# Patient Record
Sex: Female | Born: 1960 | Race: White | Hispanic: No
Health system: Midwestern US, Academic
[De-identification: ages and names within clinical notes are randomized; demographics above are authoritative.]

---

## 2016-06-21 ENCOUNTER — Encounter: Admit: 2016-06-21 | Discharge: 2016-06-21 | Payer: Commercial Managed Care - PPO

## 2016-06-22 MED ORDER — PROPRANOLOL 60 MG PO CS24
ORAL_CAPSULE | Freq: Every day | 1 refills | Status: AC
Start: 2016-06-22 — End: 2017-01-18

## 2017-01-16 ENCOUNTER — Encounter: Admit: 2017-01-16 | Discharge: 2017-01-16 | Payer: Commercial Managed Care - HMO

## 2017-01-18 MED ORDER — PROPRANOLOL 60 MG PO CS24
ORAL_CAPSULE | Freq: Every day | 0 refills | Status: AC
Start: 2017-01-18 — End: 2017-03-29

## 2017-03-27 ENCOUNTER — Encounter: Admit: 2017-03-27 | Discharge: 2017-03-27 | Payer: Commercial Managed Care - HMO

## 2017-03-29 MED ORDER — PROPRANOLOL 60 MG PO CS24
ORAL_CAPSULE | Freq: Every day | 0 refills | Status: AC
Start: 2017-03-29 — End: 2017-06-28

## 2017-04-14 ENCOUNTER — Ambulatory Visit: Admit: 2017-04-14 | Discharge: 2017-04-15 | Payer: No Typology Code available for payment source

## 2017-04-14 ENCOUNTER — Encounter: Admit: 2017-04-14 | Discharge: 2017-04-14 | Payer: Commercial Managed Care - HMO

## 2017-04-14 DIAGNOSIS — R198 Other specified symptoms and signs involving the digestive system and abdomen: Principal | ICD-10-CM

## 2017-04-14 DIAGNOSIS — I471 Supraventricular tachycardia: Principal | ICD-10-CM

## 2017-04-15 ENCOUNTER — Encounter: Admit: 2017-04-15 | Discharge: 2017-04-15 | Payer: Commercial Managed Care - HMO

## 2017-04-28 ENCOUNTER — Ambulatory Visit: Admit: 2017-04-28 | Discharge: 2017-04-28 | Payer: No Typology Code available for payment source

## 2017-04-28 ENCOUNTER — Encounter: Admit: 2017-04-28 | Discharge: 2017-04-28 | Payer: Commercial Managed Care - HMO

## 2017-04-28 DIAGNOSIS — I471 Supraventricular tachycardia: Principal | ICD-10-CM

## 2017-05-03 ENCOUNTER — Encounter: Admit: 2017-05-03 | Discharge: 2017-05-03 | Payer: Commercial Managed Care - HMO

## 2017-05-03 DIAGNOSIS — R0600 Dyspnea, unspecified: ICD-10-CM

## 2017-05-03 DIAGNOSIS — I471 Supraventricular tachycardia: Principal | ICD-10-CM

## 2017-05-04 ENCOUNTER — Encounter: Admit: 2017-05-04 | Discharge: 2017-05-05

## 2017-05-05 ENCOUNTER — Encounter: Admit: 2017-05-05 | Discharge: 2017-05-05 | Payer: Commercial Managed Care - HMO

## 2017-06-27 ENCOUNTER — Encounter: Admit: 2017-06-27 | Discharge: 2017-06-27 | Payer: Commercial Managed Care - HMO

## 2017-06-28 MED ORDER — PROPRANOLOL 60 MG PO CS24
ORAL_CAPSULE | Freq: Every day | 2 refills | Status: AC
Start: 2017-06-28 — End: 2018-03-28

## 2018-01-14 ENCOUNTER — Encounter: Admit: 2018-01-14 | Discharge: 2018-01-14 | Payer: Commercial Managed Care - HMO

## 2018-01-24 ENCOUNTER — Encounter: Admit: 2018-01-24 | Discharge: 2018-01-24 | Payer: Commercial Managed Care - HMO

## 2018-02-03 ENCOUNTER — Encounter: Admit: 2018-02-03 | Discharge: 2018-02-03 | Payer: Commercial Managed Care - HMO

## 2018-02-07 ENCOUNTER — Encounter: Admit: 2018-02-07 | Discharge: 2018-02-07 | Payer: Commercial Managed Care - HMO

## 2018-03-26 ENCOUNTER — Encounter: Admit: 2018-03-26 | Discharge: 2018-03-26 | Payer: Commercial Managed Care - HMO

## 2018-03-26 DIAGNOSIS — R002 Palpitations: ICD-10-CM

## 2018-03-26 DIAGNOSIS — I471 Supraventricular tachycardia: Principal | ICD-10-CM

## 2018-03-28 MED ORDER — PROPRANOLOL 60 MG PO CS24
ORAL_CAPSULE | Freq: Every day | 0 refills | Status: AC
Start: 2018-03-28 — End: 2018-07-11

## 2018-07-10 ENCOUNTER — Encounter: Admit: 2018-07-10 | Discharge: 2018-07-10

## 2018-07-10 DIAGNOSIS — I471 Supraventricular tachycardia: Secondary | ICD-10-CM

## 2018-07-10 DIAGNOSIS — R002 Palpitations: Secondary | ICD-10-CM

## 2018-07-11 MED ORDER — PROPRANOLOL 60 MG PO CS24
ORAL_CAPSULE | Freq: Every day | 0 refills | Status: DC
Start: 2018-07-11 — End: 2018-08-06

## 2018-07-24 ENCOUNTER — Encounter: Admit: 2018-07-24 | Discharge: 2018-07-24

## 2018-07-26 ENCOUNTER — Encounter: Admit: 2018-07-26 | Discharge: 2018-07-26

## 2018-07-26 LAB — BASIC METABOLIC PANEL
Lab: 0.6
Lab: 106
Lab: 141
Lab: 27
Lab: 3.3 — ABNORMAL LOW (ref 3.4–5.1)
Lab: 8
Lab: 8.8
Lab: 81
Lab: <8 — ABNORMAL LOW (ref 9–23)
Lab: >60
Lab: >60

## 2018-07-26 NOTE — Telephone Encounter
07/26/18 called and left message for pt to call back. Need to verify pcp and to make sure she hasn't been seen anywhere since last seen here in 2019 edh

## 2018-07-28 ENCOUNTER — Encounter: Admit: 2018-07-28 | Discharge: 2018-07-28

## 2018-07-29 ENCOUNTER — Encounter: Admit: 2018-07-29 | Discharge: 2018-07-29

## 2018-07-29 ENCOUNTER — Ambulatory Visit: Admit: 2018-07-29 | Discharge: 2018-07-30

## 2018-07-29 DIAGNOSIS — I471 Supraventricular tachycardia: Secondary | ICD-10-CM

## 2018-07-29 DIAGNOSIS — R198 Other specified symptoms and signs involving the digestive system and abdomen: Secondary | ICD-10-CM

## 2018-07-29 NOTE — Patient Instructions
We would like you to follow up in  3 months with Dr Arrie Eastern.     Please schedule the following testing: Regadenoson Stress Test.  [facility] Scheduling will call you with appointment time.     In order to provide you the best care possible we ask that you follow up as below:    For NON-URGENT questions please contact us through your MyChart account.   For all medication refills please contact your pharmacy or send a request through [place].   For all questions that may need to be addressed urgently please call the nursing triage line at [phone] Monday - Friday 8-5 only. Please leave a detailed message with your name, date of birth, and reason for your call.    To schedule an appointment call [phone].     Please allow 10-15 business days for the results of any testing to be reviewed. Please call our office if you have not heard from a nurse within this time frame.

## 2018-07-29 NOTE — Progress Notes
Patient requested MPI to be done at [facility]. Requested PA.  Will fax order after PA complete.

## 2018-07-30 ENCOUNTER — Encounter: Admit: 2018-07-30 | Discharge: 2018-07-30

## 2018-07-30 DIAGNOSIS — R198 Other specified symptoms and signs involving the digestive system and abdomen: Secondary | ICD-10-CM

## 2018-08-02 ENCOUNTER — Ambulatory Visit: Admit: 2018-08-02 | Discharge: 2018-08-03

## 2018-08-02 ENCOUNTER — Encounter: Admit: 2018-08-02 | Discharge: 2018-08-02

## 2018-08-02 DIAGNOSIS — I471 Supraventricular tachycardia: Secondary | ICD-10-CM

## 2018-08-03 ENCOUNTER — Encounter: Admit: 2018-08-03 | Discharge: 2018-08-03

## 2018-08-03 ENCOUNTER — Ambulatory Visit: Admit: 2018-08-12 | Discharge: 2018-08-12

## 2018-08-03 DIAGNOSIS — E782 Mixed hyperlipidemia: Secondary | ICD-10-CM

## 2018-08-03 DIAGNOSIS — R9439 Abnormal result of other cardiovascular function study: Principal | ICD-10-CM

## 2018-08-03 DIAGNOSIS — R0600 Dyspnea, unspecified: Secondary | ICD-10-CM

## 2018-08-03 DIAGNOSIS — Z1159 Encounter for screening for other viral diseases: Secondary | ICD-10-CM

## 2018-08-03 MED ORDER — NITROGLYCERIN 0.4 MG SL SUBL
.4 mg | SUBLINGUAL | 0 refills | Status: CN | PRN
Start: 2018-08-03 — End: ?

## 2018-08-03 MED ORDER — DOCUSATE SODIUM 100 MG PO CAP
100 mg | Freq: Every day | ORAL | 0 refills | Status: CN | PRN
Start: 2018-08-03 — End: ?

## 2018-08-03 MED ORDER — ACETAMINOPHEN 325 MG PO TAB
650 mg | ORAL | 0 refills | Status: CN | PRN
Start: 2018-08-03 — End: ?

## 2018-08-03 MED ORDER — ALUMINUM-MAGNESIUM HYDROXIDE 200-200 MG/5 ML PO SUSP
30 mL | ORAL | 0 refills | Status: CN | PRN
Start: 2018-08-03 — End: ?

## 2018-08-03 MED ORDER — TEMAZEPAM 15 MG PO CAP
15 mg | Freq: Every evening | ORAL | 0 refills | Status: CN | PRN
Start: 2018-08-03 — End: ?

## 2018-08-03 NOTE — Progress Notes
Deb with [facility], [phone], confirmed benefits and eligibility:  Current and active since 04/13/2014, $350 deductible with required co-insurance of 20% to max OOP $1400, then plan will pay 100% of allowable charges.  No pre-certification is required for [facility] [id number].  Reference #[id number]

## 2018-08-03 NOTE — Patient Instructions
CARDIAC CATHETERIZATION   PRE-ADMISSION INSTRUCTIONS    Patient Name: Katie Wood  MRN#: [id number]  Date of Birth: 01/17/1960 (58 y.o.)  Today's Date: 08/03/2018    PROCEDURE:  You are scheduled for a Left Diagnostic Coronary Angiogram and Coronary Angiogram with possible Angioplasty/Stenting with Dr. Harley Alto.    PROCEDURE DATE AND ARRIVAL TIME:    Your procedure date is 08/12/18.  You will receive a call from the Cath lab staff between 8:00 a.m. and noon on the business day prior to your procedure to let you know at what time to arrive on the day of your procedure.    Please check in at the Admitting Desk in the [facility] for your procedure. [facility] Entrance and and take a right. Continue down the hallway past [facility] office. That hall will take you into the [facility]. Check in at the desk on the left side.)     (If you have further questions regarding your arrival time for the CV lab, please call [phone] by 3:00pm the day before your procedure. Please leave a message with your name and number, your call will be returned in a timely manner.)    SPECIAL MEDICATION INSTRUCTIONS  Nothing to eat after midnight before your procedure. No caffeine for 24 hours prior to your procedure. You will be under general anesthesia for this procedure.  DO NOT eat or drink anything after midnight the night before your procedure.     Any new prescriptions will be sent to your pharmacy listed on file with [place].   HOLD ALL over the counter vitamins or supplements on the morning of your procedure.  Please either take 4 baby aspirins (4 times 81mg ) or one full strength NON-COATED 325mg  aspirin.       Additional Instructions  If you wear CPAP, please bring your mask and machine with you to the hospital.    Take a bath or shower with anti-bacterial soap the evening before, or the morning of the procedure.     Bring photo ID and your health insurance card(s). Arrange for a driver to take you home from the hospital. Please arrange for a friend or family member to take you home from this test. You cannot take a Taxi, [employer], or public transportation as there has to be a responsible person to help care for you after sedation    Bring an accurate list of your current medications with you to the hospital (all medications and supplements taken daily).  Please use the medication list below and write in the date and time when you took your last dose before your procedure. Update this list of medications as needed.      Wear comfortable clothes and don't bring valuables, other than photo identification card, with you to the hospital.    Please pack a bag for an overnight stay.     Please review your pre-procedure instructions and bring them with you on the day of your procedure.  Call the office at [phone] with any questions. You may ask to speak with Dr. Adella Hare Dendi's nurse.        ALLERGIES  No Known Allergies    CURRENT MEDICATIONS   Kaytlynne, Basil   Home Medication Instructions HAR:    Printed on:08/03/18 1533   Medication Information                      baclofen (LIORESAL) 10 mg tablet  Take  10 mg by mouth twice daily.             cetirizine (ZYRTEC) 10 mg tablet  Take 10 mg by mouth every morning.             duloxetine DR (CYMBALTA) 60 mg capsule  Take 60 mg by mouth daily.             dupilumab (DUPIXENT) 300 mg/2 mL injectable SYRINGE  Inject 300 mg under the skin every 14 days.             linaclotide(+) (LINZESS) 290 mcg capsule  Take 290 mcg by mouth daily 30 minutes before breakfast.             omeprazole (PRILOSEC OTC) 20 mg PO tablet  take 20 mg by mouth Daily.              omeprazole DR (PRILOSEC) 20 mg capsule  Take 20 mg by mouth daily before breakfast.             pregabalin (LYRICA) 50 mg capsule  Take 50 mg by mouth twice daily.             propranolol LA (INDERAL LA) 60 mg capsule  Take 1 capsule by mouth once daily traZODone (DESYREL) 50 mg tablet  Take 50 mg by mouth daily.               _________________________________________  Form completed by: Maryann Conners, RN  Date completed: 08/03/18  Method: Via MyChart.

## 2018-08-04 ENCOUNTER — Encounter: Admit: 2018-08-04 | Discharge: 2018-08-04

## 2018-08-04 NOTE — Telephone Encounter
-----   Message from [place] sent at 08/04/2018 11:47 AM CDT -----  Regarding: RAD-Upcoming Surgery  She has questions about upcoming Surgery. [phone]

## 2018-08-04 NOTE — Telephone Encounter
I spoke with patient regarding her questions with cardiac cath on 08/12/18.     We discussed that her knee surgery may have to be postponed if a stent is placed due to being on ASA and Plavix.     Pt wants to keep the procedure day on 7/31 instead of moving it up.    Pt had no further questions or concerns at this time.

## 2018-08-05 NOTE — Progress Notes
Interventional Cardiology Progress Note     Procedure Date: 08/12/2018     Considered Procedure(s):  left cardiac catheterization      Indication:   abnormal stress test   ___________________________________________________________________________________________________________________________________    Chief Complaint: Palpitations     Assessment and Plan:     AVNRT status post ablation  GERD  Headaches  Hyperlipidemia  Fibromyalgia    Regadenoson thallium stress test 08/02/2018    Left Ventricular Ejection Fraction (post stress, in the resting state) =??? 63 %.   ???  Left Ventricular End Diastolic Volume: 84 mL  ???  SUMMARY/OPINION:??????This study is abnormal.  There is a moderate size, mild to moderate intensity, mostly reversible perfusion defect involving the anterior septal wall from the base to the apical segments and the mid to apical anterior wall.  This suggests ischemia in an LAD type distribution.  The presence of breast tissue attenuation does decreases specificity of the study. Left ventricular systolic function is normal.  There are no wall motion abnormalities present.  There are no high risk prognostic indicators present.  The pharmacologic ECG portion of the study is negative for ischemia.  ???  There are no prior studies available for comparison.       Patient referred for consideration of cardiac catheterization due to abnormal stress test (details above). Labs were reviewed without significant findings. Cr today 0.71. Patient denies allergy to contrast dye or iodine. The procedure as well as the risks and benefits were explained ot the patient at length.  Patient is agreeable to proceed with cardiac catheterization to further define coronary anatomy.      Jearld Lesch, APRN-NP  Interventional Cardiology   Pager [phone]    ____________________________________________________________________________________________________________________________________ History of Present Illness: Katie Wood is a 58 y.o. female with a history of AVNRT status post ablation 2008, GERD, fibromyalgia, headaches, and hyperlipidemia.  She follows with Dr. Wallene Huh as part of perioperative cardiovascular risk assessment prior to knee surgery.  She reported new onset of shortness of breath, weight gain, dyspnea on exertion, and edema.  Subsequent regadenoson thallium stress test was abnormal and revealed moderate size, mild to moderate intensity, mostly reversible perfusion defect involving the anterior septal wall from the base to the apical segments into the mid to apical anterior wall.  This suggest ischemia in the LAD type distribution.  LV systolic function normal.  No wall motion abnormalities.Patient referred for consideration of cardiac catheterization to further define coronary anatomy.     Today, patient reports palpitations. She denies chest pain, palpitations, SOA, DOE, orthopnea, PND, change in weight, dizziness, syncope, diaphoresis, abnormal bleeding, N/V/D, chills, or fever.     Patient Active Problem List    Diagnosis Date Noted   ??? Abnormal stress test 08/12/2018   ??? Fatigue 07/24/2018   ??? Mixed hyperlipidemia 02/03/2016   ??? Postural dizziness with near syncope 04/09/2015   ??? Family history of hypertension in mother 03/26/2015   ??? Family history of hyperlipidemia 03/26/2015   ??? PSVT (paroxysmal supraventricular tachycardia) (HCC) 01/27/2006   ??? Heart palpitations 01/27/2006   ??? Vertigo 01/27/2006   ??? Uterine fibroid 01/27/2006     Medical History:   Diagnosis Date   ??? Symptoms involving digestive system       History reviewed. No pertinent surgical history.   Medications Prior to Admission   Medication Sig Dispense Refill Last Dose   ??? baclofen (LIORESAL) 10 mg tablet Take 10 mg by mouth twice daily.   08/11/2018   ???  cetirizine (ZYRTEC) 10 mg tablet Take 10 mg by mouth every morning.   08/11/2018   ??? duloxetine DR (CYMBALTA) 60 mg capsule Take 60 mg by mouth daily. 08/11/2018   ??? dupilumab (DUPIXENT) 300 mg/2 mL injectable SYRINGE Inject 300 mg under the skin every 14 days.   08/11/2018   ??? linaclotide(+) (LINZESS) 290 mcg capsule Take 290 mcg by mouth daily 30 minutes before breakfast.   08/11/2018   ??? omeprazole (PRILOSEC OTC) 20 mg PO tablet take 20 mg by mouth Daily.    08/11/2018   ??? pregabalin (LYRICA) 50 mg capsule Take 50 mg by mouth twice daily.   08/11/2018   ??? propranolol LA (INDERAL LA) 60 mg capsule Take 1 capsule by mouth once daily 90 capsule 3 08/11/2018   ??? traZODone (DESYREL) 50 mg tablet Take 50 mg by mouth daily.   08/11/2018     No Known Allergies      No current facility-administered medications on file prior to encounter.      Current Outpatient Medications on File Prior to Encounter   Medication Sig Dispense Refill   ??? baclofen (LIORESAL) 10 mg tablet Take 10 mg by mouth twice daily.     ??? cetirizine (ZYRTEC) 10 mg tablet Take 10 mg by mouth every morning.     ??? duloxetine DR (CYMBALTA) 60 mg capsule Take 60 mg by mouth daily.     ??? dupilumab (DUPIXENT) 300 mg/2 mL injectable SYRINGE Inject 300 mg under the skin every 14 days.     ??? linaclotide(+) (LINZESS) 290 mcg capsule Take 290 mcg by mouth daily 30 minutes before breakfast.     ??? omeprazole (PRILOSEC OTC) 20 mg PO tablet take 20 mg by mouth Daily.      ??? pregabalin (LYRICA) 50 mg capsule Take 50 mg by mouth twice daily.     ??? traZODone (DESYREL) 50 mg tablet Take 50 mg by mouth daily.         Social History:   Social History     Tobacco Use   ??? Smoking status: Passive Smoke Exposure - Never Smoker   ??? Smokeless tobacco: Never Used   Substance Use Topics   ??? Alcohol use: Yes     Comment: Occasional      Family History   Problem Relation Age of Onset   ??? Hypertension Mother    ??? Cancer Father    ??? Thyroid Disease Sister    ??? Cancer Brother         Review of Systems  General: negative/normal.  Eyes:  negative/normal.  Ears/Nose/Throat:  negative/normal.  Cardiovascular:  palpitations Respiratory: negative/normal.    Gastrointestinal:  negative/normal.  Genitourinary:  negative/normal.  Musculoskeletal:  negative/normal.  Skin: negative/normal.   Neurologic:  negative/normal.  Psychiatric:  negative/normal.  Endocrine:  negative/normal.  Heme/Lymphatic: negative/normal.  Allergic/Immunologic:  negative/normal.      Physical Exam:  Vital Signs: Last Filed In 24 Hours Vital Signs: 24 Hour Range   BP: 147/86 (07/31 0800)  Temp: 36.9 ???C (98.4 ???F) (07/31 0740)  Pulse: 67 (07/31 0800)  Respirations: 15 PER MINUTE (07/31 0800)  SpO2: 99 % (07/31 0800)  SpO2 Pulse: 66 (07/31 0800)  Height: 170.2 cm (5' 7) (07/31 0740) BP: (137-147)/(86-88)   Temp:  [36.9 ???C (98.4 ???F)]   Pulse:  [67]   Respirations:  [15 PER MINUTE-16 PER MINUTE]   SpO2:  [98 %-99 %]  Gen: appears stated age, in no acute distress  Head: normocephalic, atraumatic  Eyes: sclera non-icteric, EOMs intact   Mouth: mucous membranes are moderately moist  Neck: no JVD  Lungs: CTA bilaterally without rales or rhonchi  Heart: RRR without murmur or gallop appreciated  Abdomen: soft, nontender, bowel sounds present  Extremities: no C/C/E, pedal pulses are intact  Skin: warm and dry  Neurological: A&Ox3, no focal deficits noted  Psychiatric: calm, pleasant, and cooperative      Lab/Radiology/Other Diagnostic Tests:  Labs:    Hematology:    Lab Results   Component Value Date    HGB 12.0 08/12/2018    HCT 35.9 08/12/2018    PLTCT 335 08/12/2018    WBC 5.4 08/12/2018    NEUT 62 01/27/2006    ANC 3.83 01/27/2006    ALC 1.48 01/27/2006    MONA 12 01/27/2006    AMC 0.73 01/27/2006    ABC 0.02 01/27/2006    MCV 89.5 08/12/2018    MCHC 33.5 08/12/2018    MPV 7.4 08/12/2018    RDW 13.7 08/12/2018   , General Chemistry:    Lab Results   Component Value Date    NA 141 08/12/2018    K 3.8 08/12/2018    CL 106 08/12/2018    GAP 7 08/12/2018    BUN 10 08/12/2018    CR 0.71 08/12/2018    GLU 99 08/12/2018    CA 9.1 08/12/2018    ALBUMIN 4.2 12/29/2017 LACTIC 3.9 12/29/2017    MG 2.5 07/01/2015    TOTBILI 0.70 12/29/2017   , HgbA1C:   Lab Results   Component Value Date    HGBA1C 5.2 07/26/2018    and Lipid Profile:   Lab Results   Component Value Date    CHOL 211 01/25/2006    TRIG 106 01/25/2006    HDL 51 01/25/2006    LDL 140 01/25/2006    VLDL 21 01/25/2006              Jearld Lesch, APRN-NP  Interventional Cardiology   Pager [phone]

## 2018-08-06 ENCOUNTER — Encounter: Admit: 2018-08-06 | Discharge: 2018-08-06

## 2018-08-06 DIAGNOSIS — R002 Palpitations: Secondary | ICD-10-CM

## 2018-08-06 DIAGNOSIS — I471 Supraventricular tachycardia: Secondary | ICD-10-CM

## 2018-08-06 MED ORDER — PROPRANOLOL 60 MG PO CS24
ORAL_CAPSULE | Freq: Every day | 3 refills | Status: DC
Start: 2018-08-06 — End: 2019-08-14

## 2018-08-10 ENCOUNTER — Ambulatory Visit: Admit: 2018-08-10 | Discharge: 2018-08-11

## 2018-08-10 DIAGNOSIS — Z01818 Encounter for other preprocedural examination: Secondary | ICD-10-CM

## 2018-08-10 NOTE — Progress Notes
Patient arrived to [facility] for COVID-19 testing 08/10/18 1829. Patient identity confirmed via photo I.D. Nasopharyngeal procedure explained to the patient.   Nasopharyngeal swab completed left  Patient education provided given and instructed patient self isolate until contacted w/ results and further instructions.   Swab collected by Joesph Fillers, RN.    Date symptoms began/reason for testing: Preop

## 2018-08-11 ENCOUNTER — Encounter: Admit: 2018-08-11 | Discharge: 2018-08-11

## 2018-08-11 DIAGNOSIS — Z1159 Encounter for screening for other viral diseases: Secondary | ICD-10-CM

## 2018-08-11 LAB — COVID-19 (SARS-COV-2) PCR

## 2018-08-11 NOTE — Progress Notes
Cardiovascular Labs Scheduling Checklist   1. Date of procedure:07/31    2. Arrival time:0720    3. Patient instructed NPO after MN; clear liquids until: 0620    4. Instructed to check-in at the heart hospital registration desk and bring photo id, insurance cards and a current list of home medications.  Pack an overnight bag in the event you are admitted overnight:Y     5. If you have a history of sleep apnea and use a C-Pap or Bi-Pap machine, please bring it with you to the hospital:N    6. Have a driver available upon discharge as you may not be cleared to drive for 24 or 48 hours post procedure. (exception is RHC/biopsy patient with internal jugular approach not receiving sedation): Y    7. If the patient's language preference is other than English, please indicated native language and need for interpreter:  N    8. Patient instructed to drink 64 oz water the day before their procedure if applicable and not contraindicated: Y    9. Patient instructed no caffeine for 24 hours before procedure: Y    10. Pre-procedure medications reviewed: Y  Hold the following Medications: Continue to take the following:                     11. Contrast allergy with need for contrast allergy prophylaxis:     12. Patient instructed to bath with antibacterial soap or surgical scrub:Y    13. List all same day pre-procedure requirements, i.e. Labs/H&P/EKG:    14. List any same day pre-procedure appointments:N    15. List any isolation precautions and organism:N    16. List any special considerations: N    17. Patient instructed to prepare for delays as there may be urgent or emergent cases: Y    18. Updated visitor guidelines reviewed  (1 visitor, only allowed in waiting room):Y    19. Patient acknowledges understanding of pre-procedure instructions: Katie Wood

## 2018-08-11 NOTE — Progress Notes
Attempted to call patient to discuss negative COVID-19 test result; no answer, left VM directing patient to MyChart, as patient is active on MyChart. RN sent MyChart message notifying patient of result.    Keishawn Darsey, RN

## 2018-08-12 ENCOUNTER — Encounter: Admit: 2018-08-12 | Discharge: 2018-08-12

## 2018-08-12 DIAGNOSIS — R0602 Shortness of breath: Secondary | ICD-10-CM

## 2018-08-12 DIAGNOSIS — I471 Supraventricular tachycardia: Secondary | ICD-10-CM

## 2018-08-12 DIAGNOSIS — R198 Other specified symptoms and signs involving the digestive system and abdomen: Secondary | ICD-10-CM

## 2018-08-12 DIAGNOSIS — R002 Palpitations: Secondary | ICD-10-CM

## 2018-08-12 LAB — CBC
Lab: 12 g/dL (ref 12.0–15.0)
Lab: 4 M/UL (ref 4.0–5.0)
Lab: 5.4 K/UL (ref 4.5–11.0)

## 2018-08-12 LAB — BASIC METABOLIC PANEL: Lab: 141 MMOL/L — ABNORMAL HIGH (ref 137–147)

## 2018-08-12 LAB — LIPID PROFILE: Lab: 293 mg/dL — ABNORMAL HIGH (ref <200)

## 2018-08-12 MED ORDER — TEMAZEPAM 15 MG PO CAP
15 mg | Freq: Every evening | ORAL | 0 refills | Status: DC | PRN
Start: 2018-08-12 — End: 2018-08-12

## 2018-08-12 MED ORDER — SODIUM CHLORIDE 0.9 % IV SOLP
250 mL | INTRAVENOUS | 0 refills | Status: CP
Start: 2018-08-12 — End: ?
  Administered 2018-08-12: 13:00:00 250 mL via INTRAVENOUS

## 2018-08-12 MED ORDER — SODIUM CHLORIDE 0.9 % IV SOLP
INTRAVENOUS | 0 refills | Status: DC
Start: 2018-08-12 — End: 2018-08-12
  Administered 2018-08-12: 13:00:00 1000.000 mL via INTRAVENOUS

## 2018-08-12 MED ORDER — NITROGLYCERIN 0.4 MG SL SUBL
.4 mg | SUBLINGUAL | 0 refills | Status: DC | PRN
Start: 2018-08-12 — End: 2018-08-12

## 2018-08-12 MED ORDER — ALUMINUM-MAGNESIUM HYDROXIDE 200-200 MG/5 ML PO SUSP
30 mL | ORAL | 0 refills | Status: DC | PRN
Start: 2018-08-12 — End: 2018-08-12

## 2018-08-12 MED ORDER — FUROSEMIDE 20 MG PO TAB
20 mg | ORAL_TABLET | Freq: Every morning | ORAL | 3 refills | 90.00000 days | Status: DC
Start: 2018-08-12 — End: 2018-10-14

## 2018-08-12 MED ORDER — POTASSIUM CHLORIDE 10 MEQ PO TBER
10 meq | ORAL_TABLET | Freq: Every day | ORAL | 3 refills | 30.00000 days | Status: DC
Start: 2018-08-12 — End: 2018-10-14

## 2018-08-12 MED ORDER — DOCUSATE SODIUM 100 MG PO CAP
100 mg | Freq: Every day | ORAL | 0 refills | Status: DC | PRN
Start: 2018-08-12 — End: 2018-08-12

## 2018-08-12 MED ORDER — ACETAMINOPHEN 325 MG PO TAB
650 mg | ORAL | 0 refills | Status: DC | PRN
Start: 2018-08-12 — End: 2018-08-12
  Administered 2018-08-12: 17:00:00 650 mg via ORAL

## 2018-08-12 NOTE — Discharge Instructions - Pharmacy
Physician Discharge Summary      Name: Katie Wood  Medical Record Number: [id number]        Account Number:  [id number]  Date Of Birth:  20-Feb-1960                         Age:  58 years   Admit date:  08/12/2018                     Discharge date:  08/12/2018    Attending Physician:  Dr. Harley Alto, MD                Service: Med-Cardiovasc    Physician Summary completed by: Jearld Lesch, APRN-NP     Reason for hospitalization: left cardiac catheterization      Significant PMH:   Medical History:   Diagnosis Date   ??? Symptoms involving digestive system        Allergies: Patient has no known allergies.    Admission Lab/Radiology studies notable for:   Hematology:    Lab Results   Component Value Date    HGB 12.0 08/12/2018    HCT 35.9 08/12/2018    PLTCT 335 08/12/2018    WBC 5.4 08/12/2018    NEUT 62 01/27/2006    ANC 3.83 01/27/2006    ALC 1.48 01/27/2006    MONA 12 01/27/2006    AMC 0.73 01/27/2006    ABC 0.02 01/27/2006    MCV 89.5 08/12/2018    MCHC 33.5 08/12/2018    MPV 7.4 08/12/2018    RDW 13.7 08/12/2018   , General Chemistry:    Lab Results   Component Value Date    NA 141 08/12/2018    K 3.8 08/12/2018    CL 106 08/12/2018    GAP 7 08/12/2018    BUN 10 08/12/2018    CR 0.71 08/12/2018    GLU 99 08/12/2018    CA 9.1 08/12/2018    ALBUMIN 4.2 12/29/2017    LACTIC 3.9 12/29/2017    MG 2.5 07/01/2015    TOTBILI 0.70 12/29/2017   , HgbA1C:   Lab Results   Component Value Date    HGBA1C 5.2 07/26/2018    and Lipid Profile:   Lab Results   Component Value Date    CHOL 293 08/12/2018    TRIG 148 08/12/2018    HDL 55 08/12/2018    LDL 201 08/12/2018    VLDL 30 08/12/2018        Brief Hospital Course:  [name] is a 58 y.o. female with a history of AVNRT status post ablation 2008, GERD, fibromyalgia, headaches, and hyperlipidemia.  She follows with Dr. Wallene Huh as part of perioperative cardiovascular risk assessment prior to knee surgery.  She reported new onset of shortness of breath, weight gain, dyspnea on exertion, and edema.  Subsequent regadenoson thallium stress test was abnormal and revealed moderate size, mild to moderate intensity, mostly reversible perfusion defect involving the anterior septal wall from the base to the apical segments into the mid to apical anterior wall.  This suggest ischemia in the LAD type distribution.  LV systolic function normal.  No wall motion abnormalities. Patient referred for  cardiac catheterization to further define coronary anatomy.     LHC showed normal coronary anatomy and LVEDP elevated to 30. Patient started on lasix 20 mg daily and K 10 meq daily.      She is a  candidate for ongoing risk factor and medical management. Heart healthy diet, exercise, and weight loss were encouraged.    Her post procedure course was uncomplicated and she was discharged home in stable condition     Upon discharge the patient and family were given post procedure instructions/restrictions as well as written instructions ( see Discharge instructions)    ____________________________________________________________________________________    DISCHARGE PLAN:    Follow up: Follow-up with Dr. Wallene Huh on 10/21 previously arranged. Follow up requested with APP in [place] [facility] in 1-2 weeks to follow up on diuretics.     Medication changes: Patient started on lasix 20 mg daily and K 10 meq daily.    Labs: BMP in 1 week ordered.     DISEASE PREVENTION:    Lipids: LDL 201.  No PTA statin. Patient denied cholesterol modifying medications and would like to work to improve through diet and exercise. Recommend to repeat in 3 months to see if lifestyle modifications alone improved cholesterol and then readdress medical therapy options.     HTN: Blood pressure adequately controlled.      Diabetes: Patient is not a diabetic.  HgA1C n/a.  Patient encouraged to follow up with primary care provider for further evaluation/ management. Tobacco: Patient is not a tobacco user.     Obesity: Body mass index is 33.73 kg/m???..  Patient will continue to manage through diet and exercise.  ____________________________________________________________________________________      Condition at Discharge: Stable. right radial access D/I, without evidence of hematoma, bleeding, or bruit. Distal pulses intact. Pt was ambulatory in the halls without complaint prior to discharge. BP (!) 146/88  - Pulse 75  - Temp 36.7 ???C (98.1 ???F)  - Ht 1.702 m (5' 7)  - Wt 97.7 kg (215 lb 6.2 oz)  - SpO2 95%  - BMI 33.73 kg/m???     Discharge Diagnoses:  normal coronary anatomy     Hospital Problems        Active Problems    * (Principal) Abnormal stress test    PSVT (paroxysmal supraventricular tachycardia) (HCC)    Heart palpitations    Mixed hyperlipidemia          Surgical Procedures: None    Significant Diagnostic Studies and Procedures:   LHC 08/12/18:  FINAL IMPRESSIONS:    1. Normal coronary arteries.  2. Elevated left ventricular end-diastolic pressure.  3. No significant gradient across the aortic valve on pullback.  ???  RECOMMENDATION:    Continue medical management and aggressive risk factor control.     Consults:  None    Patient Disposition: Home       Patient instructions/medications:       BASIC METABOLIC PANEL   Standing Status: Future Standing Exp. Date: 08/12/19    Fax: [phone]  Attn: Dr. Wallene Huh     Cardiac Diet    Limiting unhealthy fats and cholesterol is the most important step you can take in reducing your risk for cardiovascular disease.  Unhealthy fats include saturated and trans fats.  Monitor your sodium and cholesterol intake.  Restrict your sodium to 2g (grams) or 2000mg  (milligrams) daily, and your cholesterol to 200mg  daily.    If you have questions regarding your diet at home, you may contact a dietitian at [phone].       Report These Signs and Symptoms    Please contact your doctor if you have any of the following symptoms: Chest pain, shortness of breath, lightheadedness, dizziness, near fainting, palpitations, abd  pain, back pain, or bleeding.     Risk Reduction Plan    Cardiac Event Personal Risk Factor Reduction Plan  **Take this sheet to your physician to show treatment recommendations**  Verlaine Embry  Admission Date: 08/12/2018 LOS: @LOS @       Blood Pressure Risk  Goal: Keep blood pressure below 130/80  Your Numbers:  BP Readings from Last 1 Encounters:  08/12/18 : 137/88     Plan: High blood pressure is the single most important risk factor for cardiac events because it's the #1 cause of cardiac events.  Take medication as prescribed and monitor your blood pressure.    Abnormal lipids (fats in blood) Risk   Goal: Total Cholesterol: <200  LDL (bad cholesterol): primary prevention <100   LDL (bad cholesterol): secondary prevention < 70  HDL (good cholesterol): >40 for men, >50 for women  Triglycerides: <150  Your Numbers: Cholesterol       Date                     Value               Ref Range           Status                01/25/2006               211 (H)             <200 MG/DL          Final            ----------  LDL       Date                     Value               Ref Range           Status                01/25/2006               140 (H)             <100 MG/DL          Final            ----------  HDL       Date                     Value               Ref Range           Status                01/25/2006               51                  >40 MG/DL           Final            ----------  Triglycerides       Date                     Value               Ref Range           Status  01/25/2006               106                 <150 MG/DL          Final            ----------  Plan: Diets high in saturated fat, trans fat and cholesterol can raise blood cholesterol levels increasing your risk of having a cardiac event.  Take medication as prescribed and eat a heart healthy, low sodium diet.    Smoking Risk Goals: If you smoke, STOP!   Plan: Smoking DOUBLES your risk of having a cardiac event. Quitting can greatly reduce your risk. To register for smoking cessation program call [phone] or visit www.smokefree.gov    Diabetes Risk   Goal: Non-diabetic: Below 5.7%  Goal for diabetic: Less than 7%  Your Numbers: Hemoglobin A1C       Date                     Value               Ref Range           Status                07/26/2018               5.2                                     Final            ----------  Plan: If you have diabetes, even if treated, you are at an increased risk of having a cardiac event.     Alcohol Use Risk  Goal: Alcohol use can lead to a cardiac event.  Plan: For men, limit intake to no more than 2 drinks per day.  For women, limit intake to no more than one drink per day.    Weight Management Risk   Goal: Healthy: BMI is 18.5 to 24.9  Overweight: BMI is 25 to 29.9  Obese: BMI is 30 or higher  Morbid Obesity: BMI [...]  Your Numbers: Your BMI (Calculated): 33.73  Plan: If you have questions about your diet after you go home, you can call a dietitian at [phone]    Physical Activity Risk   Goal: Patients should have approval by a physician prior to beginning an exercise program.  Plan: Try to get at least 30 minutes of moderate physical activity five days a week or 20 minutes of vigorous physical activity three days a week with your doctor's approval.    To the Neurology and the NeuroSurg Discharge order sets set add a new order in the education section titled Risk Reduction Plan, in the comments section add the following (default select this new order for neuro and neuro surg and ensure this information is added to the AVS).     Questions About Your Stay    For questions or concerns regarding your hospital stay:    - DURING BUSINESS HOURS (8:00 AM - 4:30 PM):    Call [phone] and asked to be transferred to your discharge attending physician. - AFTER BUSINESS HOURS (4:30 PM - 8:00 AM, on weekends, or holidays):  Call [phone] and ask the operator to page the on-call doctor for the discharge attending physician.  Discharging attending physician: Harley Alto [[id number]]      Procedure Specific Activity    *May return to work/school in 2 days.  *May shower after discharge.  *NO lifting, pushing or pulling more than 5 pounds for one week to affected hand. You may use your wrist splint for a reminder!  *NO strenuous activity for 1 week.  *NO driving for 2 days.  *NO baths or swimming for 1 week.  *NO sexual activity for 1 week.     Incision Care    *Call if there is an increase in pain, swelling, or redness.  *DO NOT soak incision in water.  *NO tub baths, hot tubs, or swimming.  *You may shower after discharge.     Request for Cardiology Appointment   Standing Status: Future Standing Exp. Date: 08/12/23    APP at [facility] to follow up on diuretics     Scheduling Priority: Routine    Location of Appointment [facility] / Mon-Wed-Fri       Current Discharge Medication List       START taking these medications    Details   furosemide (LASIX) 20 mg tablet Take one tablet by mouth every morning.  Qty: 90 tablet, Refills: 3    PRESCRIPTION TYPE:  Normal      potassium chloride (KLOR-CON) 10 mEq tablet Take one tablet by mouth daily. Take with a meal and a full glass of water.  Qty: 90 tablet, Refills: 3    PRESCRIPTION TYPE:  Normal          CONTINUE these medications which have NOT CHANGED    Details   baclofen (LIORESAL) 10 mg tablet Take 10 mg by mouth twice daily.    PRESCRIPTION TYPE:  Historical Med      cetirizine (ZYRTEC) 10 mg tablet Take 10 mg by mouth every morning.    PRESCRIPTION TYPE:  Historical Med      duloxetine DR (CYMBALTA) 60 mg capsule Take 60 mg by mouth daily.    PRESCRIPTION TYPE:  Historical Med      dupilumab (DUPIXENT) 300 mg/2 mL injectable SYRINGE Inject 300 mg under the skin every 14 days. PRESCRIPTION TYPE:  Historical Med      linaclotide(+) (LINZESS) 290 mcg capsule Take 290 mcg by mouth daily 30 minutes before breakfast.    PRESCRIPTION TYPE:  Historical Med      omeprazole (PRILOSEC OTC) 20 mg PO tablet take 20 mg by mouth Daily.     PRESCRIPTION TYPE:  Historical Med      pregabalin (LYRICA) 50 mg capsule Take 50 mg by mouth twice daily.    PRESCRIPTION TYPE:  Historical Med      propranolol LA (INDERAL LA) 60 mg capsule Take 1 capsule by mouth once daily  Qty: 90 capsule, Refills: 3    PRESCRIPTION TYPE:  Normal  Associated Diagnoses: PSVT (paroxysmal supraventricular tachycardia) (HCC); Heart palpitations      traZODone (DESYREL) 50 mg tablet Take 50 mg by mouth daily.    PRESCRIPTION TYPE:  Historical Med           Scheduled appointments:    Nov 02, 2018  8:00 AM CDT  Return Patient with Jen Mow, MD  [facility] (CVM Exam) [address]  [place] [address]  [phone]          Pending items needing follow up: as a bove.     Signed:  Jearld Lesch, APRN-NP  08/15/2018      cc:  Primary Care Physician:  Erskine Emery   Verified  Referring physicians:  Erskine Emery, MD   Additional provider(s):

## 2018-08-12 NOTE — Progress Notes
Patient discharged to home with all belongings.  Discharge instructions, med reconciliation and home wound care instructions given and explained to patient and family both verbally and written.  Accompanied by spouse.  No complaints of pain or discomfort.  Right radial site remains clean, dry, and intact with no evidence of a hematoma or bleeding after ambulation.  Patient escorted to lobby via Therapist, sports.  Patient to follow up with [facility]) or on-call physician with any additional questions or concerns.  All contact numbers provided.  Patient and family acceptant of DC instuctions and report understanding to all information.

## 2018-08-12 NOTE — H&P (View-Only)
Patient presents for cardiac procedure. Please see previously completed H&P below.    Newton Pigg, APRN-C (pager [phone])    ______________________________________________     Jearld Lesch APRN-NP   Nurse Practitioner   Cardiology-Interventional   Progress Notes   Signed   Creation Time:  08/05/18 1019                   Interventional Cardiology Progress Note   ???  Procedure Date: 08/12/2018   ???  Considered Procedure(s):  left cardiac catheterization    ???  Indication:   abnormal stress test   ___________________________________________________________________________________________________________________________________  ???  Chief Complaint: Palpitations   ???  Assessment and Plan:   ???  AVNRT status post ablation  GERD  Headaches  Hyperlipidemia  Fibromyalgia  ???  Regadenoson thallium stress test 08/02/2018  ???  Left Ventricular Ejection Fraction (post stress, in the resting state) =??????63 %.   ???  Left Ventricular End Diastolic Volume: 84 mL  ???  SUMMARY/OPINION:??????This study is abnormal. ???There is a moderate size, mild to moderate intensity, mostly reversible perfusion defect involving the anterior septal wall from the base to the apical segments and the mid to apical anterior wall. ???This suggests ischemia in an LAD type distribution. ???The presence of breast tissue attenuation does decreases specificity of the study. Left ventricular systolic function is normal. ???There are no wall motion abnormalities present. ???There are no high risk prognostic indicators present. ???The pharmacologic ECG portion of the study is negative for ischemia.  ???  There are no prior studies available for comparison.   ???  ???  Patient referred for consideration of cardiac catheterization due to abnormal stress test (details above). Labs were reviewed without significant findings. Cr today 0.71. Patient denies allergy to contrast dye or iodine. The procedure as well as the risks and benefits were explained ot the patient at length.  Patient is agreeable to proceed with cardiac catheterization to further define coronary anatomy.  ???  ???  Jearld Lesch, APRN-NP  Interventional Cardiology   Pager [phone]  ???  ____________________________________________________________________________________________________________________________________  ???  History of Present Illness: Katie Wood is a 58 y.o. female with a history of AVNRT status post ablation 2008, GERD, fibromyalgia, headaches, and hyperlipidemia.  She follows with Dr. Wallene Huh as part of perioperative cardiovascular risk assessment prior to knee surgery.  She reported new onset of shortness of breath, weight gain, dyspnea on exertion, and edema.  Subsequent regadenoson thallium stress test was abnormal and revealed moderate size, mild to moderate intensity, mostly reversible perfusion defect involving the anterior septal wall from the base to the apical segments into the mid to apical anterior wall.  This suggest ischemia in the LAD type distribution.  LV systolic function normal.  No wall motion abnormalities.Patient referred for consideration of cardiac catheterization to further define coronary anatomy.   ???  Today, patient reports palpitations. She denies chest pain, palpitations, SOA, DOE, orthopnea, PND, change in weight, dizziness, syncope, diaphoresis, abnormal bleeding, N/V/D, chills, or fever.   ???       Patient Active Problem List   ??? Diagnosis Date Noted   ??? Abnormal stress test 08/12/2018   ??? Fatigue 07/24/2018   ??? Mixed hyperlipidemia 02/03/2016   ??? Postural dizziness with near syncope 04/09/2015   ??? Family history of hypertension in mother 03/26/2015   ??? Family history of hyperlipidemia 03/26/2015   ??? PSVT (paroxysmal supraventricular tachycardia) (HCC) 01/27/2006   ??? Heart palpitations 01/27/2006   ??? Vertigo  01/27/2006   ??? Uterine fibroid 01/27/2006   ???       Medical History:   Diagnosis Date   ??? Symptoms involving digestive system ??? History reviewed. No pertinent surgical history.   Prescriptions Prior to Admission           Medications Prior to Admission   Medication Sig Dispense Refill Last Dose   ??? baclofen (LIORESAL) 10 mg tablet Take 10 mg by mouth twice daily. ??? ??? 08/11/2018   ??? cetirizine (ZYRTEC) 10 mg tablet Take 10 mg by mouth every morning. ??? ??? 08/11/2018   ??? duloxetine DR (CYMBALTA) 60 mg capsule Take 60 mg by mouth daily. ??? ??? 08/11/2018   ??? dupilumab (DUPIXENT) 300 mg/2 mL injectable SYRINGE Inject 300 mg under the skin every 14 days. ??? ??? 08/11/2018   ??? linaclotide(+) (LINZESS) 290 mcg capsule Take 290 mcg by mouth daily 30 minutes before breakfast. ??? ??? 08/11/2018   ??? omeprazole (PRILOSEC OTC) 20 mg PO tablet take 20 mg by mouth Daily.  ??? ??? 08/11/2018   ??? pregabalin (LYRICA) 50 mg capsule Take 50 mg by mouth twice daily. ??? ??? 08/11/2018   ??? propranolol LA (INDERAL LA) 60 mg capsule Take 1 capsule by mouth once daily 90 capsule 3 08/11/2018   ??? traZODone (DESYREL) 50 mg tablet Take 50 mg by mouth daily. ??? ??? 08/11/2018      ???  No Known Allergies  ???  ???  No current facility-administered medications on file prior to encounter.    ???         Current Outpatient Medications on File Prior to Encounter   Medication Sig Dispense Refill   ??? baclofen (LIORESAL) 10 mg tablet Take 10 mg by mouth twice daily. ??? ???   ??? cetirizine (ZYRTEC) 10 mg tablet Take 10 mg by mouth every morning. ??? ???   ??? duloxetine DR (CYMBALTA) 60 mg capsule Take 60 mg by mouth daily. ??? ???   ??? dupilumab (DUPIXENT) 300 mg/2 mL injectable SYRINGE Inject 300 mg under the skin every 14 days. ??? ???   ??? linaclotide(+) (LINZESS) 290 mcg capsule Take 290 mcg by mouth daily 30 minutes before breakfast. ??? ???   ??? omeprazole (PRILOSEC OTC) 20 mg PO tablet take 20 mg by mouth Daily.  ??? ???   ??? pregabalin (LYRICA) 50 mg capsule Take 50 mg by mouth twice daily. ??? ???   ??? traZODone (DESYREL) 50 mg tablet Take 50 mg by mouth daily. ??? ???   ???  ???  Social History:   Social History   ???  Tobacco Use ??? Smoking status: Passive Smoke Exposure - Never Smoker   ??? Smokeless tobacco: Never Used   Substance Use Topics   ??? Alcohol use: Yes   ??? ??? Comment: Occasional            Family History   Problem Relation Age of Onset   ??? Hypertension Mother ???   ??? Cancer Father ???   ??? Thyroid Disease Sister ???   ??? Cancer Brother ???      ???  Review of Systems  General: negative/normal.  Eyes:??? negative/normal.  Ears/Nose/Throat:??? negative/normal.  Cardiovascular:??? palpitations   Respiratory: negative/normal.???   Gastrointestinal:??? negative/normal.  Genitourinary:??? negative/normal.  Musculoskeletal:??? negative/normal.  Skin: negative/normal.   Neurologic:??? negative/normal.  Psychiatric:??? negative/normal.  Endocrine:??? negative/normal.  Heme/Lymphatic: negative/normal.  Allergic/Immunologic:??? negative/normal.  ???  ???  Physical Exam:  Vital Signs: Last Filed In 24 Hours Vital Signs: 24  Hour Range   BP: 147/86 (07/31 0800)  Temp: 36.9 ???C (98.4 ???F) (07/31 0740)  Pulse: 67 (07/31 0800)  Respirations: 15 PER MINUTE (07/31 0800)  SpO2: 99 % (07/31 0800)  SpO2 Pulse: 66 (07/31 0800)  Height: 170.2 cm (5' 7) (07/31 0740) BP: (137-147)/(86-88)   Temp:  [36.9 ???C (98.4 ???F)]   Pulse:  [67]   Respirations:  [15 PER MINUTE-16 PER MINUTE]   SpO2:  [98 %-99 %]     ???   ???     Gen: appears stated age, in no acute distress  Head: normocephalic, atraumatic  Eyes: sclera non-icteric, EOMs intact   Mouth: mucous membranes are moderately moist  Neck: no JVD  Lungs: CTA bilaterally without rales or rhonchi  Heart: RRR without murmur or gallop appreciated  Abdomen: soft, nontender, bowel sounds present  Extremities: no C/C/E, pedal pulses are intact  Skin: warm and dry  Neurological: A&Ox3, no focal deficits noted  Psychiatric: calm, pleasant, and cooperative  ???  ???  Lab/Radiology/Other Diagnostic Tests:  Labs:    Hematology:          Lab Results   Component Value Date   ??? HGB 12.0 08/12/2018   ??? HCT 35.9 08/12/2018   ??? PLTCT 335 08/12/2018   ??? WBC 5.4 08/12/2018 ??? NEUT 62 01/27/2006   ??? ANC 3.83 01/27/2006   ??? ALC 1.48 01/27/2006   ??? MONA 12 01/27/2006   ??? AMC 0.73 01/27/2006   ??? ABC 0.02 01/27/2006   ??? MCV 89.5 08/12/2018   ??? MCHC 33.5 08/12/2018   ??? MPV 7.4 08/12/2018   ??? RDW 13.7 08/12/2018   , General Chemistry:          Lab Results   Component Value Date   ??? NA 141 08/12/2018   ??? K 3.8 08/12/2018   ??? CL 106 08/12/2018   ??? GAP 7 08/12/2018   ??? BUN 10 08/12/2018   ??? CR 0.71 08/12/2018   ??? GLU 99 08/12/2018   ??? CA 9.1 08/12/2018   ??? ALBUMIN 4.2 12/29/2017   ??? LACTIC 3.9 12/29/2017   ??? MG 2.5 07/01/2015   ??? TOTBILI 0.70 12/29/2017   , HgbA1C:         Lab Results   Component Value Date   ??? HGBA1C 5.2 07/26/2018    and Lipid Profile:         Lab Results   Component Value Date   ??? CHOL 211 01/25/2006   ??? TRIG 106 01/25/2006   ??? HDL 51 01/25/2006   ??? LDL 140 01/25/2006   ??? VLDL 21 01/25/2006   ???  ???  ???   ???  ???  Jearld Lesch, APRN-NP  Interventional Cardiology   Pager [phone]  ???  ???  ???

## 2018-08-12 NOTE — Patient Instructions
Post Procedure Radial Access Discharge Instructions-TUKH  ???  When you go home:  ??? You may shower 24 hours after your procedure.  ??? Keep your puncture sites clean. Do not submerge your arm in a hot tub, pool, lake or tub for 1 week.  ??? Keep the area clean and dry for one week (except for daily showers).  ??? Be sure your hands are clean when touching near the site.  ??? If a band-aid or dressing is still in place remove it before showering.  ??? Wash and dry thoroughly but gently.  ??? If needed, for your comfort, you may place a clean band-aid over the puncture site after you are clean and dry. It is best to leave it open to air as soon as it is comfortable to do so.  ??? Do not use ointments, creams, or powders on puncture site.  ??? Inspect site daily.???  Activity: (Unless otherwise instructed or unable to perform)  ??? Avoid any exertion for one week. Exertion is lifting over 5 lbs, or pushing and pulling with the affected arm.  ??? Do not take blood pressure or have blood drawn from the affected arm for 3 days.  ??? No straining for one week.  ??? You may be up and about while relaxing at home as you recover.  ??? You may resume sexual activity in one week.  ??? You may begin driving 2 days after your procedure if you are otherwise able to drive.???  ---It is common to have mild soreness and/or a small, soft bruise around the site that can take up to two weeks to go away. ???A small amount of blood (not more than a teaspoon) from the site is also common.???  WHEN TO CALL THE DOCTOR: Complications are rare but can happen.  ??? If you have significant bleeding (more than a teaspoon) sit down, apply firm pressure at the site and call 911. Bleeding from a large vessel needs professional help.???  ??? If you have a lump underneath the skin (more than a pea size) at the site.  ??? If you have signs of infection at the site such as: redness, warm to touch, drainage, increasing soreness, a fever (100 degrees or more) and/or chills. ??? Soreness that continues more than a week or unusual pain at the puncture site.  ??? Numbness, tingling, weakness in the affected arm.  ??? If your arm becomes cold and pale.  ??? If you have changes of vision, slurred speech or one-sided weakness.?????????  Who do I contact if I need to speak with someone?  During Business Hours:  ??? [place] [facility] Office at the [facility]: [phone] [facility])  ??? [place]:[phone] (Monday-Friday)  ??? [place]: [phone] (Monday-Friday)  ??? [place]: [phone] (Tuesday and Thursday)  ??? [place]: [phone] (Monday-Friday)  ??? [facility]: [phone] (Tuesday, Wednesday and Friday)  ??? [place]:[phone] (Tuesday, Wednesday and Friday)  Nights and Weekends  ??? [place] [facility] Office at the [facility]: [phone]???  This???information ???is meant to serve as a resource to you and your family. It is not meant to be all inclusive. The members of the [facility] at [facility], [phone], will be glad to answer any questions you may have about this booklet or your procedure.

## 2018-08-12 NOTE — Progress Notes
Patient arrived on unit via ambulation accompanied by family. Patient transferred to the bed without assistance. Frailty score equals 3  Assessment completed, refer to flowsheet for details. Orders released, reviewed, and implemented as appropriate. Oriented to surroundings, call light within reach. Plan of care reviewed.  Will continue to monitor and assess.

## 2018-08-17 ENCOUNTER — Encounter: Admit: 2018-08-17 | Discharge: 2018-08-17

## 2018-08-17 DIAGNOSIS — R002 Palpitations: Secondary | ICD-10-CM

## 2018-08-17 DIAGNOSIS — R9439 Abnormal result of other cardiovascular function study: Secondary | ICD-10-CM

## 2018-08-17 DIAGNOSIS — E782 Mixed hyperlipidemia: Secondary | ICD-10-CM

## 2018-08-17 DIAGNOSIS — I471 Supraventricular tachycardia: Secondary | ICD-10-CM

## 2018-08-17 LAB — BASIC METABOLIC PANEL
Lab: 0.7
Lab: 10
Lab: 106 — ABNORMAL HIGH (ref 70–105)
Lab: 81
Lab: 9.3
Lab: 106 K/UL (ref 0–0.45)
Lab: 141 K/UL (ref 1.0–4.8)
Lab: 25 K/UL (ref 0–0.20)
Lab: 4.6 K/UL (ref 0–0.80)

## 2018-08-29 ENCOUNTER — Encounter: Admit: 2018-08-29 | Discharge: 2018-08-29

## 2018-08-29 ENCOUNTER — Ambulatory Visit: Admit: 2018-08-29 | Discharge: 2018-08-30 | Payer: Commercial Managed Care - HMO

## 2018-08-29 DIAGNOSIS — I471 Supraventricular tachycardia: Secondary | ICD-10-CM

## 2018-08-29 DIAGNOSIS — R198 Other specified symptoms and signs involving the digestive system and abdomen: Secondary | ICD-10-CM

## 2018-08-29 NOTE — Patient Instructions
Continue on current medications.     Keep follow up with Dr. Arrie Eastern in October.         In order to provide you the best care possible we ask that you follow up as below:    Please consider signing up for mychart.    For all questions, please call the nursing triage voicemail at [phone] Monday - Friday 8-5 only. Please leave a detailed message with your name, date of birth, and reason for your call.     To schedule an appointment, please call [phone].     Please allow ~ 10 business days for the results of any testing to be reviewed. Please call our office if you have not heard from a nurse within this time frame

## 2018-08-29 NOTE — Progress Notes
Date of Service: 08/29/2018    Katie Wood is a 58 y.o. female.       HPI     Katie Wood resents to clinic as 2-week follow-up post left heart catheterization. She follows with Dr. Wallene Huh and at [facility] visit with him in July he ordered a stress test because the patient is on flecainide for PSVT and had some SOB, weight gain and DOE.  The stress test was abnormal and the patient had a left heart catheterization August 12, 2018 by Dr. Chales Abrahams. The LHC showed normal coronary arteries and elevated left ventricular end-diastolic pressure (30 mm Hg) and the patient was prescribed Lasix 20 mg daily with potassium supplement.  She has been on the diuretic she lost about 7 pounds which she assumes is water weight.  Her shortness of breath is not any better or any worse with the diuretics.  She has had the shortness of breath since she had her original right total knee replacement about 1 year ago.  The shortness of breath is not associated with palpitations.  She says she will get short of breath doing things that she used to do in the past without any issue such as vacuuming, putting the dishes away, walking up to 37 stairs to work.    She continues to have palpitations on a daily basis.  The frequency is about the same as after she had an ablation with Dr. Betti Cruz in 2008.  She continues to take propranolol and the palpitations do not seem to bother her.    About 2 weeks ago she had a revision of her right knee surgery.  Staples are still in place.  She is going to physical therapy about 3 times a week. BMP on 8/7 was wnl. Other medical history includes hyperlipidemia, postural dizziness, fibromyalgia, and fatigue.        Vitals:    08/29/18 1406   BP: 124/76   BP Source: Arm, Left Upper   Pulse: 75   Temp: 36.7 ???C (98.1 ???F)   SpO2: 97%   Weight: 93 kg (205 lb)   Height: 1.702 m (5' 7)   PainSc: Zero     Body mass index is 32.11 kg/m???.     Past Medical History  Patient Active Problem List    Diagnosis Date Noted ??? Abnormal stress test 08/12/2018   ??? Fatigue 07/24/2018     04/29/2018 - EXED:  Non-diagnostic exercise stress echo due to inability to achieve target workload or target heart.  Patient stopped prematurely due to right knee pain.  At 4.1 METS and 77% of predicted maximal heart rate:  No ischemic echocardiographic changes.  No ischemic ECG changes.  Occasional PVCs in recovery.  RESTING ECHO:  Normal left ventricular size with concentric remodeling normal left ventricular ejection fraction of 55-60%.  Normal right ventricular size and systolic function.  No hemodynamically significant valvular abnormalities.  08/02/2018 - Regadenoson MPI:  This study is abnormal.  There is a moderate size, mild to moderate intensity, mostly reversible perfusion defect involving the anterior septal wall from the base to the apical segments and the mid to apical anterior wall.  This suggests ischemia in an LAD type distribution.  The presence of breast tissue attenuation does decreases specificity of the study. Left ventricular systolic function is normal.  There are no wall motion abnormalities present.  There are no high risk prognostic indicators present.  The pharmacologic ECG portion of the study is negative for ischemia.  08/12/2018 -  LHC:  Normal coronary arteries.  Elevated left ventricular end-diastolic pressure.  No significant gradient across the aortic valve on pullback.     ??? Mixed hyperlipidemia 02/03/2016   ??? Postural dizziness with near syncope 04/09/2015   ??? Family history of hypertension in mother 03/26/2015   ??? Family history of hyperlipidemia 03/26/2015     Mother       ??? PSVT (paroxysmal supraventricular tachycardia) (HCC) 01/27/2006      Ablation 01/2006 Holter 2008-average AV rate of 79, 1 episode of non conducted beat 06/2006     ??? Heart palpitations 01/27/2006   ??? Vertigo 01/27/2006     Recent ER 02/2015, 2D  doppler Echo 2008 neg bubble study with/without Valsalva Has been followed by Neurologist also ??? Uterine fibroid 01/27/2006         Review of Systems   Constitution: Negative.   HENT: Negative.         Hears HB in ears.   Eyes: Negative.    Cardiovascular: Positive for dyspnea on exertion and irregular heartbeat.   Respiratory: Negative.    Endocrine: Negative.    Hematologic/Lymphatic: Negative.    Skin: Negative.    Musculoskeletal: Negative.    Gastrointestinal: Negative.    Genitourinary: Negative.    Neurological: Positive for dizziness.        Positional dizziness, when sitting up.   Psychiatric/Behavioral: Negative.    Allergic/Immunologic: Negative.        Physical Exam   Constitutional:   Obese with BMI 32   HENT:   Head: Normocephalic.   Neck: No JVD present.   Cardiovascular: Normal rate, regular rhythm and normal heart sounds.   No murmur heard.  Pulmonary/Chest: Effort normal and breath sounds normal.   Musculoskeletal:         General: No edema.      Comments: No peripheral edema  Right knee edema noted   Neurological: She is alert and oriented to person, place, and time.   Skin: Skin is warm and dry.   Completely healed right radial puncture site without firmness.  Right knee staple line OTA   Psychiatric: She has a normal mood and affect. Her behavior is normal.         Cardiovascular Studies  08/12/2018 LHC: normal coronaries, elevated LVEDP of 30 mm Hg  08/29/2018 EKG: Sinus rhythm at 65 bpm with nonspecific IVCD.  PR 166 ms, QRS duration 117 ms, QT C4 30 ms    Problems Addressed Today  Encounter Diagnoses   Name Primary?   ??? PSVT (paroxysmal supraventricular tachycardia) (HCC) Yes       Assessment and Plan     Katie Wood continues to have palpitations while on propranolol. Last event monitor was in 2017 which showed short runs of SVT with the longest run being in the middle of the night; No AF/AFL, pauses; patient triggers associated with isolated pac's and pvc's.   She does not want to have another event monitor at this time due to her decreased activities since having right knee surgery again.  She thinks she will be more mobile in October when she has an appointment with Dr. Wallene Huh and would like to have EVM at that time.    Plan:  - continue current medications including lasix and potassium   - Follow up with Dr. Wallene Huh in October, as planned, and consider EVM if palpitations continue or worsen    Roosvelt Maser, APRN-C (pager [phone])  Heart Rhythm Management pager [phone]  Current Medications (including today's revisions)  ??? baclofen (LIORESAL) 10 mg tablet Take 10 mg by mouth twice daily.   ??? celecoxib (CELEBREX) 200 mg capsule Take 1 capsule by mouth daily. Take 1 capsule by mouth once daily. Start 5 days before surgery and continue for 30 days after surgery.   ??? cetirizine (ZYRTEC) 10 mg tablet Take 10 mg by mouth every morning.   ??? duloxetine DR (CYMBALTA) 60 mg capsule Take 60 mg by mouth daily.   ??? dupilumab (DUPIXENT) 300 mg/2 mL injectable SYRINGE Inject 300 mg under the skin every 14 days.   ??? furosemide (LASIX) 20 mg tablet Take one tablet by mouth every morning.   ??? linaclotide(+) (LINZESS) 290 mcg capsule Take 290 mcg by mouth daily 30 minutes before breakfast.   ??? omeprazole (PRILOSEC OTC) 20 mg PO tablet take 20 mg by mouth Daily.    ??? potassium chloride (KLOR-CON) 10 mEq tablet Take one tablet by mouth daily. Take with a meal and a full glass of water.   ??? pregabalin (LYRICA) 50 mg capsule Take 50 mg by mouth twice daily.   ??? propranolol LA (INDERAL LA) 60 mg capsule Take 1 capsule by mouth once daily   ??? traZODone (DESYREL) 50 mg tablet Take 50 mg by mouth daily.

## 2018-10-10 NOTE — Telephone Encounter
Left VM to CB.

## 2018-10-10 NOTE — Telephone Encounter
RAD- RC, she is at [phone].    I called and spoke with patient. She states that the swelling is bilateral. Patient states that she has had weight gain, and SOB with exercise. Offered OV with Sharee Pimple for Wednesday at 3:00pm. Patient agreeable with no further concerns or questions.

## 2018-10-10 NOTE — Telephone Encounter
-----   Message from [place] sent at 10/10/2018  1:25 PM CDT -----  Regarding: RAD-Swelling gotten worse  Patient states swelling has gotten worse in feet and legs. Sometimes it hurts. Please call her at [phone]

## 2018-10-14 ENCOUNTER — Encounter: Admit: 2018-10-14 | Discharge: 2018-10-14 | Payer: Commercial Managed Care - HMO

## 2018-10-14 ENCOUNTER — Ambulatory Visit: Admit: 2018-10-14 | Discharge: 2018-10-14 | Payer: Commercial Managed Care - HMO

## 2018-10-14 DIAGNOSIS — I5189 Other ill-defined heart diseases: Secondary | ICD-10-CM

## 2018-10-14 DIAGNOSIS — R198 Other specified symptoms and signs involving the digestive system and abdomen: Secondary | ICD-10-CM

## 2018-10-14 DIAGNOSIS — R030 Elevated blood-pressure reading, without diagnosis of hypertension: Secondary | ICD-10-CM

## 2018-10-14 LAB — COMPREHENSIVE METABOLIC PANEL
Lab: 0.3 mg/dL (ref 0.3–1.2)
Lab: 0.7 mg/dL (ref 0.4–1.00)
Lab: 106 MMOL/L (ref 98–110)
Lab: 141 MMOL/L — ABNORMAL LOW (ref 137–147)
Lab: 3.8 MMOL/L — ABNORMAL LOW (ref 3.5–5.1)
Lab: 7 mg/dL (ref 7–25)
Lab: 9.5 mg/dL — ABNORMAL HIGH (ref 8.5–10.6)
Lab: 96 mg/dL (ref 70–100)

## 2018-10-14 LAB — BNP (B-TYPE NATRIURETIC PEPTI): Lab: 85 pg/mL (ref 0–100)

## 2018-10-14 LAB — CBC AND DIFF
Lab: 4.1 M/UL (ref 4.0–5.0)
Lab: 6.1 10*3/uL (ref 4.5–11.0)

## 2018-10-14 MED ORDER — POTASSIUM CHLORIDE 10 MEQ PO TBER
20 meq | ORAL_TABLET | Freq: Every day | ORAL | 3 refills | 30.00000 days | Status: AC
Start: 2018-10-14 — End: ?

## 2018-10-14 MED ORDER — FUROSEMIDE 20 MG PO TAB
40 mg | ORAL_TABLET | Freq: Every morning | ORAL | 3 refills | 90.00000 days | Status: AC
Start: 2018-10-14 — End: ?

## 2018-10-14 NOTE — Patient Instructions
-   It was nice to see you today!    - Let's increase the furosemide to 40 mg daily until the swelling is gone, then reduce it back down to 20 mg daily. Take 10 mEq of potassium for each 20 mg of furosemide. Limit your total fluid intake to 48-64 ounces per day. Try to limit your sodium to 2,000 mg per day or less.     - Start monitoring daily am weights. Take note of your weight when you feel like the swelling has completely resolved.  If you gain 2 lbs. in 1 day or 5 lbs. over 1 week, it is due to fluid retention. In that case, you may need to take an additional tablet of the furosemide that day.     - Please start monitoring your blood pressure and make a log of your readings. We may need to treat it if you are getting consistent reading 130/80 or above.     - I have ordered an echocardiogram. It can be done in [place]. Let's see if you can schedule it to be done on the same day you see Dr. Arrie Eastern.     - Please send [place] a MyChart message or call our nurse line at [phone] with any questions or concerns in the meantime.

## 2018-10-14 NOTE — Progress Notes
Date of Service: 10/14/2018    Katie Wood is a 58 y.o. female.       HPI    I had the pleasure of seeing Katie Wood today regarding increased edema last week.  She is a very pleasant 58 year old female with a past medical history of AVNRT s/p ablation in 2008, diastolic dysfunction, stress headaches, skin allergies, joint pain, fibromyalgia, GERD and hyperlipidemia.  She was noting increasing shortness of breath and a stress test in July was abnormal and she underwent LHC and coronary angiography on 08/12/2018 and had normal coronary arteries, without evidence of significant coronary artery disease.  Left ventricular end-diastolic pressure was elevated at 30 mmHg.  She was started on Lasix 20 mg daily after her heart catheterization.    Katie Wood reports that last week she noticed increased pedal edema to the point that it was difficult to flex her toes.  She cannot think of anything which may have precipitated the swelling, such as increased sodium intake.  She takes dupilumab injections every 2 weeks to treat pruritus and it has been effective.  Her injection was last week and she wonders if this could be the cause of the swelling.  She also notes increased exertional dyspnea.  She has 30 steps that she walks up on the way to work and she has to stop and catch her breath when she gets to the top.  She denies any chest pain or pressure.  She notes occasional palpitations, particularly when she is feeling stressed.        Vitals:    10/14/18 1320   BP: (!) 140/90   BP Source: Arm, Left Upper   Pulse: 80   SpO2: 98%   Weight: 99.4 kg (219 lb 3.2 oz)   Height: 1.702 m (5' 7)   PainSc: Zero     Body mass index is 34.33 kg/m?Marland Kitchen     Past Medical History  Patient Active Problem List    Diagnosis Date Noted   ? Diastolic dysfunction 10/14/2018   ? Elevated blood pressure reading without diagnosis of hypertension 10/14/2018   ? Abnormal stress test 08/12/2018   ? Fatigue 07/24/2018 04/29/2018 - [facility]:  Non-diagnostic exercise stress echo due to inability to achieve target workload or target heart.  Patient stopped prematurely due to right knee pain.  At 4.1 METS and 77% of predicted maximal heart rate:  No ischemic echocardiographic changes.  No ischemic ECG changes.  Occasional PVCs in recovery.  RESTING ECHO:  Normal left ventricular size with concentric remodeling normal left ventricular ejection fraction of 55-60%.  Normal right ventricular size and systolic function.  No hemodynamically significant valvular abnormalities.  08/02/2018 - Regadenoson MPI:  This study is abnormal.  There is a moderate size, mild to moderate intensity, mostly reversible perfusion defect involving the anterior septal wall from the base to the apical segments and the mid to apical anterior wall.  This suggests ischemia in an LAD type distribution.  The presence of breast tissue attenuation does decreases specificity of the study. Left ventricular systolic function is normal.  There are no wall motion abnormalities present.  There are no high risk prognostic indicators present.  The pharmacologic ECG portion of the study is negative for ischemia.  08/12/2018 - LHC:  Normal coronary arteries.  Elevated left ventricular end-diastolic pressure.  No significant gradient across the aortic valve on pullback.     ? Mixed hyperlipidemia 02/03/2016   ? Postural dizziness with near syncope 04/09/2015   ?  Family history of hypertension in mother 03/26/2015   ? Family history of hyperlipidemia 03/26/2015     Mother       ? PSVT (paroxysmal supraventricular tachycardia) (HCC) 01/27/2006      Ablation 01/2006 Holter 2008-average AV rate of 79, 1 episode of non conducted beat 06/2006     ? Heart palpitations 01/27/2006   ? Vertigo 01/27/2006     Recent ER 02/2015, 2D  doppler Echo 2008 neg bubble study with/without Valsalva Has been followed by Neurologist also     ? Uterine fibroid 01/27/2006     Review of Systems Constitution: Positive for malaise/fatigue.   HENT: Negative.    Eyes: Negative.    Cardiovascular: Negative.    Respiratory: Positive for shortness of breath.    Endocrine: Negative.    Hematologic/Lymphatic: Negative.    Skin: Negative.    Musculoskeletal: Positive for joint swelling (feet and ankles).   Gastrointestinal: Positive for bloating.   Genitourinary: Negative.    Neurological: Negative.    Psychiatric/Behavioral: Negative.    Allergic/Immunologic: Negative.      Physical Exam  General Appearance: no acute distress  Skin: warm & intact  HEENT: unremarkable  Neck Veins: neck veins are flat & not distended  Chest Inspection: chest is normal in appearance  Auscultation/Percussion: lungs clear to auscultation, no rales, rhonchi, or wheezing  Cardiac Rhythm: regular rhythm & normal rate  Cardiac Auscultation: Normal S1 & S2, no S3 or S4, no rub  Murmurs: no cardiac murmurs   Extremities: trace to 1+ lower extremity edema; 2+ symmetric distal pulses  Abdominal Exam: soft, non-tender, bowel sounds normal  Neurologic Exam: oriented to time, place and person; no focal neurologic deficits  Psychiatric: Normal mood and affect.  Behavior is normal. Judgment and thought content normal.     Cardiovascular Studies  ECG was not repeated today.  Until her swelling has resolved, then may decrease it back down to    Problems Addressed Today  Encounter Diagnoses   Name Primary?   ? Diastolic dysfunction Yes   ? Elevated blood pressure reading without diagnosis of hypertension      Assessment and Plan    In conclusion, Katie Wood noted increased lower extremity edema last week without definite precipitating cause.  I do not see that edema or fluid retention listed as a potential side effect of dupilumab.  LVEDP was significantly elevated at 30 mmHg on heart catheterization at the end of July, indicative of diastolic dysfunction.  We discussed this today.  I would like to check some labs today, including a CMP, CBC and BNP.  I would like to repeat a 2D echo with Doppler.  She will increase her furosemide to 40 mg daily and increase potassium chloride to 20 mEq daily until the swelling has resolved, then may decrease the doses back down to 20 mg of furosemide along with 10 mEq of potassium chloride.  She will start monitoring daily morning weights.  I asked her to take note of her morning weight when she feels like her swelling has resolved.  We discussed that if she gains 2 pounds in 24 hours or 5 pounds over 1 week, it is due to fluid retention and she may increase her furosemide back up to 40 mg daily until her weight is back down.  A 2 gram sodium per day diet and moderate fluid restriction were encouraged. Her blood pressure is elevated at 140/90 today.  She is going to start monitoring her blood pressure  at home and make a log of her readings to take with her when she follows up with Dr. Wallene Huh on 10/21.  If she is getting readings consistently greater than 130/80, we may need to treat. Spironolactone may be a good choice, given the diastolic dysfunction.          Current Medications (including today's revisions)  ? baclofen (LIORESAL) 10 mg tablet Take 10 mg by mouth twice daily.   ? celecoxib (CELEBREX) 200 mg capsule Take 1 capsule by mouth daily. Take 1 capsule by mouth once daily. Start 5 days before surgery and continue for 30 days after surgery.   ? cetirizine (ZYRTEC) 10 mg tablet Take 10 mg by mouth every morning.   ? duloxetine DR (CYMBALTA) 60 mg capsule Take 60 mg by mouth daily.   ? dupilumab (DUPIXENT) 300 mg/2 mL injectable SYRINGE Inject 300 mg under the skin every 14 days.   ? furosemide (LASIX) 20 mg tablet Take two tablets by mouth every morning.   ? linaclotide(+) (LINZESS) 290 mcg capsule Take 290 mcg by mouth daily 30 minutes before breakfast.   ? omeprazole (PRILOSEC OTC) 20 mg PO tablet take 20 mg by mouth Daily.    ? potassium chloride (KLOR-CON) 10 mEq tablet Take two tablets by mouth daily. Take with a meal and a full glass of water.   ? pregabalin (LYRICA) 50 mg capsule Take 50 mg by mouth twice daily.   ? propranolol LA (INDERAL LA) 60 mg capsule Take 1 capsule by mouth once daily   ? traZODone (DESYREL) 50 mg tablet Take 50 mg by mouth daily.

## 2019-05-15 ENCOUNTER — Encounter: Admit: 2019-05-15 | Discharge: 2019-05-15 | Payer: Commercial Managed Care - PPO

## 2019-05-15 DIAGNOSIS — R002 Palpitations: Secondary | ICD-10-CM

## 2019-05-15 DIAGNOSIS — R198 Other specified symptoms and signs involving the digestive system and abdomen: Secondary | ICD-10-CM

## 2019-05-15 MED ORDER — ATORVASTATIN 20 MG PO TAB
20 mg | ORAL_TABLET | Freq: Every day | ORAL | 11 refills | Status: AC
Start: 2019-05-15 — End: ?

## 2019-06-19 ENCOUNTER — Encounter: Admit: 2019-06-19 | Discharge: 2019-06-19 | Payer: Commercial Managed Care - PPO

## 2019-06-19 DIAGNOSIS — R002 Palpitations: Secondary | ICD-10-CM

## 2019-06-19 LAB — LIPID PROFILE
Lab: 115
Lab: 166
Lab: 23
Lab: 3
Lab: 52
Lab: 91

## 2019-06-20 ENCOUNTER — Encounter: Admit: 2019-06-20 | Discharge: 2019-06-20 | Payer: Commercial Managed Care - PPO

## 2019-06-20 NOTE — Telephone Encounter
-----   Message from Golda Acre, LPN sent at 04/13/3242  2:24 PM CDT -----  Regarding: RAD- results and HM ?  VM on triage line from patient.  1- she had her lipids drawn and was to call back to see if she is to stay on or off the medication.  2- she has question about heart monitor that she was to wear but could not afford it.  Call her at #[phone].

## 2019-06-20 NOTE — Telephone Encounter
Called pt back. She is curious about her lipid panel from yesterday. I assured pt that it has been resulted and sent on to RAD to review. Informed her that we would call her back once he has reviewed. Pt also states that the Zio patch that was ordered is not affordable for her. Wants to know if there is another option. Will review with RAD and call her back with recs. Pt verbalized understanding and agreement to plan.   Pt also states she had gallbladder taken out yesterday, semi-urgently but is feeling fairly well today.

## 2019-07-13 ENCOUNTER — Encounter: Admit: 2019-07-13 | Discharge: 2019-07-13 | Payer: Commercial Managed Care - PPO

## 2019-07-14 ENCOUNTER — Encounter: Admit: 2019-07-14 | Discharge: 2019-07-14 | Payer: Commercial Managed Care - PPO

## 2019-07-14 NOTE — Progress Notes
Long Term Monitor Placement Record  Ordering Physician: Jen Mow  Diagnosis: palpitation  Length: 14 days  Serial Number: [id number]

## 2019-07-14 NOTE — Progress Notes
Enrollment complete  Spoke with patient, she's currently in [id number] with her husband, stated that to bring over there if I could and was going to wait a couple of days before placing the Zio due to stress from her husband being in the hospital.

## 2019-07-18 ENCOUNTER — Encounter: Admit: 2019-07-18 | Discharge: 2019-07-18 | Payer: Commercial Managed Care - PPO

## 2019-08-12 ENCOUNTER — Encounter: Admit: 2019-08-12 | Discharge: 2019-08-12 | Payer: Commercial Managed Care - PPO

## 2019-08-12 DIAGNOSIS — R002 Palpitations: Secondary | ICD-10-CM

## 2019-08-12 DIAGNOSIS — I471 Supraventricular tachycardia: Secondary | ICD-10-CM

## 2019-08-14 MED ORDER — PROPRANOLOL 60 MG PO CS24
60 mg | ORAL_CAPSULE | Freq: Every evening | ORAL | 0 refills | Status: AC
Start: 2019-08-14 — End: ?

## 2019-09-17 ENCOUNTER — Encounter: Admit: 2019-09-17 | Discharge: 2019-09-17 | Payer: Commercial Managed Care - PPO

## 2019-09-17 DIAGNOSIS — I471 Supraventricular tachycardia: Secondary | ICD-10-CM

## 2019-09-17 DIAGNOSIS — R002 Palpitations: Secondary | ICD-10-CM

## 2019-09-18 MED ORDER — PROPRANOLOL 60 MG PO CS24
60 mg | ORAL_CAPSULE | Freq: Every evening | ORAL | 3 refills | Status: AC
Start: 2019-09-18 — End: ?

## 2019-11-15 ENCOUNTER — Encounter: Admit: 2019-11-15 | Discharge: 2019-11-15 | Payer: Commercial Managed Care - PPO

## 2019-12-02 ENCOUNTER — Encounter: Admit: 2019-12-02 | Discharge: 2019-12-02 | Payer: Commercial Managed Care - PPO

## 2019-12-02 MED ORDER — POTASSIUM CHLORIDE 10 MEQ PO TBER
ORAL_TABLET | Freq: Every day | 0 refills
Start: 2019-12-02 — End: ?

## 2020-01-12 ENCOUNTER — Encounter: Admit: 2020-01-12 | Discharge: 2020-01-12 | Payer: Commercial Managed Care - PPO

## 2020-01-12 MED ORDER — FUROSEMIDE 20 MG PO TAB
ORAL_TABLET | 0 refills
Start: 2020-01-12 — End: ?

## 2020-03-08 ENCOUNTER — Ambulatory Visit: Admit: 2020-03-08 | Discharge: 2020-03-08 | Payer: Commercial Managed Care - PPO

## 2020-03-08 ENCOUNTER — Encounter: Admit: 2020-03-08 | Discharge: 2020-03-08 | Payer: Commercial Managed Care - PPO

## 2020-03-08 DIAGNOSIS — R198 Other specified symptoms and signs involving the digestive system and abdomen: Secondary | ICD-10-CM

## 2020-03-08 DIAGNOSIS — I471 Supraventricular tachycardia: Secondary | ICD-10-CM

## 2020-03-08 DIAGNOSIS — I10 Essential (primary) hypertension: Secondary | ICD-10-CM
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
## 2020-03-11 ENCOUNTER — Encounter: Admit: 2020-03-11 | Discharge: 2020-03-11 | Payer: Commercial Managed Care - PPO

## 2020-03-11 NOTE — Progress Notes
Request for the following medical records, for the purpose Continuity of Care.     Please send the following:      Most Recent Labs     Please Fax to:   FAX#: [phone]  Attn: Sherry

## 2020-03-22 ENCOUNTER — Encounter: Admit: 2020-03-22 | Discharge: 2020-03-22 | Payer: Commercial Managed Care - PPO

## 2020-05-19 ENCOUNTER — Encounter: Admit: 2020-05-19 | Discharge: 2020-05-19 | Payer: Commercial Managed Care - PPO

## 2020-05-19 MED ORDER — ATORVASTATIN 20 MG PO TAB
ORAL_TABLET | Freq: Every day | 3 refills | Status: AC
Start: 2020-05-19 — End: ?

## 2020-05-24 ENCOUNTER — Encounter: Admit: 2020-05-24 | Discharge: 2020-05-24 | Payer: Commercial Managed Care - PPO

## 2020-05-24 DIAGNOSIS — R002 Palpitations: Secondary | ICD-10-CM

## 2020-06-21 ENCOUNTER — Encounter: Admit: 2020-06-21 | Discharge: 2020-06-21 | Payer: Commercial Managed Care - PPO

## 2020-06-27 ENCOUNTER — Encounter: Admit: 2020-06-27 | Discharge: 2020-06-27 | Payer: Commercial Managed Care - PPO

## 2020-07-26 ENCOUNTER — Encounter: Admit: 2020-07-26 | Discharge: 2020-07-26 | Payer: Commercial Managed Care - PPO

## 2020-09-27 ENCOUNTER — Encounter: Admit: 2020-09-27 | Discharge: 2020-09-27 | Payer: Commercial Managed Care - PPO

## 2020-09-27 DIAGNOSIS — I471 Supraventricular tachycardia: Secondary | ICD-10-CM

## 2020-09-27 DIAGNOSIS — R002 Palpitations: Secondary | ICD-10-CM

## 2020-09-27 MED ORDER — PROPRANOLOL 60 MG PO CS24
60 mg | ORAL_CAPSULE | Freq: Every evening | ORAL | 0 refills | Status: AC
Start: 2020-09-27 — End: ?

## 2020-09-27 MED ORDER — ATORVASTATIN 20 MG PO TAB
ORAL_TABLET | Freq: Every day | 0 refills | Status: AC
Start: 2020-09-27 — End: ?

## 2020-11-12 ENCOUNTER — Encounter: Admit: 2020-11-12 | Discharge: 2020-11-12 | Payer: Commercial Managed Care - PPO

## 2020-11-12 DIAGNOSIS — R002 Palpitations: Secondary | ICD-10-CM

## 2020-11-12 DIAGNOSIS — I471 Supraventricular tachycardia: Secondary | ICD-10-CM

## 2020-11-12 MED ORDER — PROPRANOLOL 60 MG PO CS24
60 mg | ORAL_CAPSULE | Freq: Every evening | ORAL | 0 refills | Status: AC
Start: 2020-11-12 — End: ?

## 2020-12-18 ENCOUNTER — Encounter: Admit: 2020-12-18 | Discharge: 2020-12-18 | Payer: Commercial Managed Care - PPO

## 2020-12-18 DIAGNOSIS — I471 Supraventricular tachycardia: Secondary | ICD-10-CM

## 2020-12-18 DIAGNOSIS — R002 Palpitations: Secondary | ICD-10-CM

## 2020-12-18 MED ORDER — PROPRANOLOL 60 MG PO CS24
60 mg | ORAL_CAPSULE | Freq: Every evening | ORAL | 3 refills | Status: AC
Start: 2020-12-18 — End: ?

## 2021-01-13 ENCOUNTER — Encounter: Admit: 2021-01-13 | Discharge: 2021-01-13 | Payer: Commercial Managed Care - PPO

## 2021-01-13 MED ORDER — POTASSIUM CHLORIDE 10 MEQ PO TBER
ORAL_TABLET | Freq: Every day | 0 refills | 30.00000 days | Status: AC
Start: 2021-01-13 — End: ?

## 2021-01-24 ENCOUNTER — Encounter: Admit: 2021-01-24 | Discharge: 2021-01-24 | Payer: Commercial Managed Care - PPO

## 2021-01-24 MED ORDER — ATORVASTATIN 20 MG PO TAB
ORAL_TABLET | Freq: Every day | 0 refills
Start: 2021-01-24 — End: ?

## 2021-01-27 ENCOUNTER — Encounter: Admit: 2021-01-27 | Discharge: 2021-01-27 | Payer: Commercial Managed Care - PPO

## 2021-03-22 ENCOUNTER — Encounter: Admit: 2021-03-22 | Discharge: 2021-03-22 | Payer: Commercial Managed Care - PPO

## 2021-03-22 MED ORDER — ATORVASTATIN 20 MG PO TAB
ORAL_TABLET | 0 refills
Start: 2021-03-22 — End: ?

## 2021-04-27 ENCOUNTER — Encounter: Admit: 2021-04-27 | Discharge: 2021-04-27 | Payer: Commercial Managed Care - PPO

## 2021-04-27 MED ORDER — PROPRANOLOL 60 MG PO CS24
60 mg | ORAL_CAPSULE | ORAL | 0 refills
Start: 2021-04-27 — End: ?

## 2021-04-27 MED ORDER — ATORVASTATIN 20 MG PO TAB
ORAL_TABLET | 0 refills
Start: 2021-04-27 — End: ?

## 2021-05-30 ENCOUNTER — Encounter: Admit: 2021-05-30 | Discharge: 2021-05-30 | Payer: Commercial Managed Care - PPO

## 2021-05-30 MED ORDER — ATORVASTATIN 20 MG PO TAB
ORAL_TABLET | 0 refills
Start: 2021-05-30 — End: ?

## 2021-05-30 MED ORDER — PROPRANOLOL 60 MG PO CS24
60 mg | ORAL_CAPSULE | ORAL | 0 refills
Start: 2021-05-30 — End: ?

## 2021-06-01 ENCOUNTER — Encounter: Admit: 2021-06-01 | Discharge: 2021-06-01 | Payer: Commercial Managed Care - PPO

## 2021-06-24 ENCOUNTER — Encounter: Admit: 2021-06-24 | Discharge: 2021-06-24 | Payer: Commercial Managed Care - PPO

## 2021-07-04 ENCOUNTER — Encounter: Admit: 2021-07-04 | Discharge: 2021-07-04 | Payer: Commercial Managed Care - PPO

## 2021-07-04 VITALS — BP 90/58 | HR 66 | Ht 66.0 in | Wt 203.5 lb

## 2021-07-04 DIAGNOSIS — E782 Mixed hyperlipidemia: Secondary | ICD-10-CM

## 2021-07-04 DIAGNOSIS — R198 Other specified symptoms and signs involving the digestive system and abdomen: Secondary | ICD-10-CM
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
## 2021-07-05 ENCOUNTER — Ambulatory Visit: Admit: 2021-07-04 | Discharge: 2021-07-05 | Payer: Commercial Managed Care - PPO

## 2021-07-05 DIAGNOSIS — I471 Supraventricular tachycardia: Principal | ICD-10-CM

## 2021-07-05 DIAGNOSIS — R002 Palpitations: Secondary | ICD-10-CM

## 2021-07-24 ENCOUNTER — Encounter: Admit: 2021-07-24 | Discharge: 2021-07-24 | Payer: Commercial Managed Care - PPO

## 2021-07-24 DIAGNOSIS — R002 Palpitations: Secondary | ICD-10-CM

## 2021-07-24 DIAGNOSIS — E782 Mixed hyperlipidemia: Secondary | ICD-10-CM

## 2021-07-24 DIAGNOSIS — I10 Essential (primary) hypertension: Secondary | ICD-10-CM

## 2021-07-24 DIAGNOSIS — I471 Supraventricular tachycardia: Secondary | ICD-10-CM

## 2021-07-24 LAB — LIPID PROFILE
CHOLESTEROL: 179
HDL: 49
LDL: 112 — ABNORMAL HIGH
TRIGLYCERIDES: 94
VLDL: 19

## 2021-07-24 LAB — COMPREHENSIVE METABOLIC PANEL
ALBUMIN: 3.8
ALK PHOSPHATASE: 114
ALT: 28
AST: 25
BLD UREA NITROGEN: 10 U/L (ref 25–110)
CALCIUM: 9.5 U/L (ref 7–56)
CHLORIDE: 110 mg/dL — ABNORMAL HIGH (ref 0.3–1.2)
CO2: 28 g/dL (ref 3.5–5.0)
CREATININE: 0.8 mg/dL — ABNORMAL HIGH (ref 70–100)
GFR ESTIMATED: 78
GLUCOSE,PANEL: 100 MMOL/L (ref 21–30)
SODIUM: 144 mg/dL (ref 8.5–10.6)
TOTAL PROTEIN: 6.9 (ref 3–12)

## 2021-07-27 ENCOUNTER — Encounter: Admit: 2021-07-27 | Discharge: 2021-07-27 | Payer: Commercial Managed Care - PPO

## 2021-07-27 MED ORDER — PROPRANOLOL 60 MG PO CS24
60 mg | ORAL_CAPSULE | Freq: Every day | ORAL | 11 refills | Status: AC
Start: 2021-07-27 — End: ?

## 2021-08-10 ENCOUNTER — Encounter: Admit: 2021-08-10 | Discharge: 2021-08-10 | Payer: Commercial Managed Care - PPO

## 2021-08-10 MED ORDER — ATORVASTATIN 20 MG PO TAB
ORAL_TABLET | 11 refills | Status: AC
Start: 2021-08-10 — End: ?

## 2021-10-28 ENCOUNTER — Encounter: Admit: 2021-10-28 | Discharge: 2021-10-28 | Payer: Commercial Managed Care - PPO

## 2022-04-28 ENCOUNTER — Encounter: Admit: 2022-04-28 | Discharge: 2022-04-28 | Payer: Commercial Managed Care - PPO

## 2022-04-28 NOTE — Telephone Encounter
-----   Message from Waverly Meerdink sent at 04/28/2022  3:22 PM CDT -----  Regarding: Cardiac Clearance  Surgery: Left Knee  Date: 4.30.24   Hospital / Facility: [facility]  Fax: [phone]    Review in Suzie RF Folder

## 2022-04-28 NOTE — Telephone Encounter
Cardiac clearance has been requested.     Patient is scheduled for left knee scope with medial meniscectomy. Its scheduled for 05/12/22.      Patient was last seen on 07/04/21.     Hx: SVT, HLD, HTN.     Last Echo: 05/08/19 EF 55-60%  Last MPI: 08/02/18.

## 2022-07-20 ENCOUNTER — Encounter: Admit: 2022-07-20 | Discharge: 2022-07-20 | Payer: Commercial Managed Care - PPO

## 2022-07-20 NOTE — Progress Notes
Rosezella [place]. Seevers, [birth year] has an appointment with Dr. Wallene Huh on 07/22/22.    Please send recent lab results for continuity of care.    Thank you,   Berit Raczkowski    Phone: [phone]  Fax: [phone]

## 2022-07-22 ENCOUNTER — Encounter: Admit: 2022-07-22 | Discharge: 2022-07-22 | Payer: Private Health Insurance - Indemnity

## 2022-07-22 ENCOUNTER — Ambulatory Visit: Admit: 2022-07-22 | Discharge: 2022-07-23 | Payer: Private Health Insurance - Indemnity

## 2022-07-22 DIAGNOSIS — R198 Other specified symptoms and signs involving the digestive system and abdomen: Secondary | ICD-10-CM

## 2022-07-22 NOTE — Progress Notes
Date of Service: 07/22/2022    Katie Wood  is a 62 y.o. female     Referred by:     HPI    Subjective         Katie Wood, a patient with a known history of AVNRT, skin allergies, multiple joint pain symptoms, and gastroesophageal reflux disease, presents for a cardiac electrophysiology follow-up visit. The patient has been experiencing palpitations, which are mostly under control, and reports low blood pressure. She has also been dealing with depression.    Over the past year, the patient has experienced occasional palpitations but has not required emergency care. She reports experiencing a pounding headache and dizziness upon standing, particularly after prolonged periods of sitting at work. The patient does not regularly monitor her blood pressure at home but has noticed that it tends to be high during medical appointments.    The patient has been experiencing increased urinary frequency, particularly at night, and drinks water throughout the day. She reports a significant increase in weight over the past year, which she attributes to decreased physical activity due to knee pain and a lack of motivation. The patient also reports blurred vision in the afternoons, which she attributes to eye strain from computer use.    The patient has been taking propranolol for palpitations, a cholesterol medication, and Cymbalta. She also takes Xanax nightly and two tablets of trazodone for sleep problems. The patient has been experiencing some dizziness and imbalance when walking, which she attributes to limping due to knee pain. She also reports experiencing a pounding sensation in her ears when lying down.    The patient has been dealing with significant personal stress, including the recent death of a loved one. She has been trying to manage this stress and has been making efforts to declutter her living environment. The patient has been taking her medications as prescribed and will contact the office if she needs any medication refills.                VITALS: BP- high  MEASUREMENTS: WT- 223  CHEST: lungs clear to auscultation  CARDIOVASCULAR: heart sounds normal  EXTREMITIES: No edema in best leg, stress edema in other leg  NEUROLOGICAL: Balance off with eyes closed, suggesting possible neuropathy            Assessment and Plan    Problems Addressed Today  Encounter Diagnoses   Name Primary?    PSVT (paroxysmal supraventricular tachycardia) (HCC) Yes            LABS  Creatinine: Normal (07/2021)  Sodium: Normal (07/2021)  Potassium: Normal (07/2021)  Liver enzymes: Normal (07/2021)  Cholesterol: 139 mg/dL (51/8841)    DIAGNOSTIC  Echocardiography: Left ventricular hypertrophy (2021)               Palpitations: Infrequent palpitations without associated significant arrhythmias. Currently managed with Propranolol.  -Continue Propranolol as prescribed.    Hypertension: Elevated blood pressure noted during the visit. Previous echocardiogram suggested thickening of the heart muscle possibly due to borderline high blood pressure.  -Advise patient to monitor blood pressure at home daily for the next week.  -If consistently high, consider adding Losartan.    Depression: Patient reports ongoing depression and recent stressors.  -Encourage patient to seek mental health support.    Weight Gain: Patient has gained 20 pounds since last year, possibly due to inactivity and dietary habits.  -Advise patient to control calorie intake, reduce carbohydrate consumption, and increase intake of vegetables and  lean proteins.  -Encourage patient to increase physical activity as tolerated.    Peripheral Neuropathy: Patient reports dizziness and imbalance, possibly due to peripheral neuropathy.  -Continue current management with Pregabalin.    General Health Maintenance:  -Advise patient to get routine blood work with primary care provider.  -Encourage patient to continue drinking water and maintain hydration.  -Continue current medications including cholesterol medication.  -Consider refills on Propranolol as needed.           All results for 12-Lead ECG this visit   ECG 12-LEAD    Collection Time: 07/22/22  4:52 PM   Result Value Status    VENTRICULAR RATE 60 Incomplete    P-R INTERVAL 168 Incomplete    QRS DURATION 90 Incomplete    Q-T INTERVAL 426 Incomplete    QTC CALCULATION (BAZETT) 426 Incomplete    P AXIS 40 Incomplete    R AXIS -3 Incomplete    T AXIS 32 Incomplete    Impression    Normal sinus rhythm  Normal ECG  When compared with ECG of 04-Jul-2021 09:52,  Criteria for Anteroseptal infarct are no longer present        Thank you for letting us participate in the care of your patient. Please feel free to contact us if you have any questions or concerns.           Vitals:    07/22/22 1645   BP: (!) 145/71   BP Source: Arm, Left Upper   Pulse: 82   SpO2: 99%   O2 Device: None (Room air)   PainSc: Zero   Weight: 101.2 kg (223 lb)   Height: 170.2 cm (5' 7)      Body mass index is 34.93 kg/m?Marland Kitchen     Past Medical History         Symptoms involving digestive system     Review of Systems   Constitutional: Negative.   HENT: Negative.     Eyes: Negative.    Cardiovascular: Negative.    Respiratory: Negative.     Endocrine: Negative.    Hematologic/Lymphatic: Negative.    Skin: Negative.    Musculoskeletal: Negative.    Gastrointestinal: Negative.    Genitourinary: Negative.    Neurological: Negative.    Psychiatric/Behavioral: Negative.     Allergic/Immunologic: Negative.         Physical Exam     Patient is a moderately well-built woman who is comfortable at rest,  not in any distress.  Sclerae anicteric.  The oral mucosa is moist and pink.  Neck is  supple without any lymphadenopathy.  Lungs are clear to auscultation bilaterally.  Breath  sounds are normal.  Cardiac exam reveals normal S1, S2 with regular rate and  rhythm.  No murmurs, rubs or gallops noted.  Abdomen:  Soft, nontender, nondistended.  Bowel sounds are present.  Extremities:  No cyanosis, clubbing or edema.  Peripheral  pulses are symmetric.  Skin without any rash.      Cardiovascular Studies           Cardiovascular Health Factors  Vitals BP Readings from Last 3 Encounters:   07/22/22 (!) 145/71   07/04/21 90/58   03/08/20 (!) 145/98     Wt Readings from Last 3 Encounters:   07/22/22 101.2 kg (223 lb)   07/04/21 92.3 kg (203 lb 8 oz)   03/08/20 86.6 kg (191 lb)     BMI Readings from Last 3 Encounters:   07/22/22 34.93 kg/m?  07/04/21 32.85 kg/m?   03/08/20 29.91 kg/m?      Smoking Social History     Tobacco Use   Smoking Status Never    Passive exposure: Yes   Smokeless Tobacco Never      Lipid Profile Cholesterol   Date Value Ref Range Status   07/24/2021 179  Final     HDL   Date Value Ref Range Status   07/24/2021 49  Final     LDL   Date Value Ref Range Status   07/24/2021 112 (H)  Final     Triglycerides   Date Value Ref Range Status   07/24/2021 94  Final      Blood Sugar Hemoglobin A1C   Date Value Ref Range Status   07/26/2018 5.2  Final     Glucose   Date Value Ref Range Status   07/24/2021 100  Final   10/14/2018 96 70 - 100 MG/DL Final   16/10/9602 540 (H) 70 - 105 Final          Current Medications (including today's revisions)   ALPRAZolam (XANAX) 0.5 mg tablet Take one tablet by mouth at bedtime as needed for Anxiety.    atorvastatin (LIPITOR) 20 mg tablet TAKE 1 TABLET BY MOUTH ONCE DAILY     baclofen (LIORESAL) 10 mg tablet Take one tablet by mouth daily.    cetirizine (ZYRTEC) 10 mg tablet Take one tablet by mouth every morning.    diclofenac sodium DR (VOLTAREN) 75 mg tablet Take one tablet by mouth twice daily. take with food    duloxetine DR (CYMBALTA) 60 mg capsule Take one capsule by mouth daily.    dupilumab (DUPIXENT) 300 mg/2 mL injectable SYRINGE Inject 2 mL under the skin every 14 days.    furosemide (LASIX) 20 mg tablet TAKE 2 TABLETS BY MOUTH IN THE MORNING    potassium chloride (KLOR-CON 10) 10 mEq tablet TAKE 2 TABLETS BY MOUTH ONCE DAILY WITH MEALS AND  A  FULL GLASS  OF  WATER    pregabalin (LYRICA) 200 mg capsule Take one capsule by mouth twice daily. 200mg  am, 400mg  pm    propranolol LA (INDERAL LA) 60 mg capsule Take 1 capsule by mouth once daily    traZODone (DESYREL) 50 mg tablet Take one tablet by mouth daily.

## 2022-07-23 DIAGNOSIS — I471 Supraventricular tachycardia, unspecified (HCC): Secondary | ICD-10-CM

## 2022-07-30 ENCOUNTER — Encounter: Admit: 2022-07-30 | Discharge: 2022-07-30 | Payer: Private Health Insurance - Indemnity

## 2022-07-30 MED ORDER — PROPRANOLOL 60 MG PO CS24
60 mg | ORAL_CAPSULE | Freq: Every day | ORAL | 0 refills
Start: 2022-07-30 — End: ?

## 2022-07-30 NOTE — Telephone Encounter
-----   Message from Dione Booze sent at 07/22/2022  5:34 PM CDT -----  Regarding: RAD - BP check  Hi!    RAD saw patient in clinic 7/10.  Her BP was elevated.  RAD requested patient take and log blood pressure for 1 week.  If consistently greater than 130/80, will start losartan 50 mg daily.      Please call patient for bp readings.     Thank you!!

## 2022-07-30 NOTE — Telephone Encounter
Mychart message sent to patient requesting BP log.

## 2022-08-11 ENCOUNTER — Encounter: Admit: 2022-08-11 | Discharge: 2022-08-11 | Payer: Private Health Insurance - Indemnity

## 2022-08-11 NOTE — Telephone Encounter
Losartan 50 mg daily was started 7/23 for elevated BP.     LVM with pt requesting CB.

## 2022-08-11 NOTE — Telephone Encounter
-----   Message from Shedd B sent at 08/11/2022  1:05 PM CDT -----  Regarding: RAD- drug reaction  VM on triage line from patient at 12:43pm.  York Spaniel that she is having reaction to medication.  Call her at #[phone].

## 2022-08-11 NOTE — Telephone Encounter
Golda Acre, LPN  P Cvm Nurse Ep Team C  RC from patient at 3:04pm.  York Spaniel that she is at work and cannot answer the phone.  She is off at 4:30pm.  Can you please call me after 4:30pm at #[phone].      Spoke with pt who tells me that she has had an upset stomach/ nausea, HA, and bilateral ankle swelling for the last 4 days. She started losartan on 7/23. She is wondering if this is a reaction to the medicine. She has not been taking her BP since she took her second dose of losartan. At that time it was systolic 113. She is not sure what diastolic was.    She has taken pepto-bismol and antinausea medicine. This has not helped with her nausea. She denies any SOB.     She requests that we do not send [facility] at this time as her computer battery is dead and she needs to get a new one.       Sent message to TM as RAD is OOO.

## 2022-08-24 LAB — BASIC METABOLIC PANEL
ANION GAP: 11 (ref 8–16)
BLD UREA NITROGEN: 10 (ref 8–16)
CHLORIDE: 108 — AB (ref 98–107)
CO2: 21 — AB (ref 23–31)
GFR ESTIMATED: 85 (ref >=59)
GLUCOSE,PANEL: 79 (ref 70–105)
POTASSIUM: 3.8 (ref 3.5–5.1)
SODIUM: 140 (ref 136–145)

## 2022-08-25 ENCOUNTER — Encounter: Admit: 2022-08-25 | Discharge: 2022-08-25 | Payer: Private Health Insurance - Indemnity

## 2022-08-25 DIAGNOSIS — I10 Essential (primary) hypertension: Secondary | ICD-10-CM

## 2022-08-25 DIAGNOSIS — Z79899 Other long term (current) drug therapy: Secondary | ICD-10-CM

## 2022-09-01 ENCOUNTER — Encounter: Admit: 2022-09-01 | Discharge: 2022-09-01 | Payer: Private Health Insurance - Indemnity

## 2022-09-01 MED ORDER — PROPRANOLOL 60 MG PO CS24
60 mg | ORAL_CAPSULE | Freq: Every day | ORAL | 0 refills | Status: AC
Start: 2022-09-01 — End: ?

## 2022-09-01 NOTE — Telephone Encounter
Called and left vm with callback information

## 2022-09-01 NOTE — Telephone Encounter
-----   Message from Leanna Battles sent at 08/31/2022  3:37 PM CDT -----  Regarding: FW: RAD- RC  Labs drawn?  ----- Message -----  From: Evlyn Clines, Wenda Low, RN  Sent: 08/31/2022  12:00 AM CDT  To: Cvm Nurse Ep Team C  Subject: FW: RAD- RC                                      Repeat labs to be completed on 8/19. She started lisinopril 8/13.  ----- Message -----  From: Golda Acre, LPN  Sent: 1/61/0960   2:05 PM CDT  To: Cvm Nurse Ep Team C  Subject: RAD- RC                                          RC from patient at 1:49 and 2:01pm for results.  Call her at #[phone].

## 2022-09-01 NOTE — Telephone Encounter
Golda Acre, LPN  P Cvm Nurse Ep Team C  RC from patient at 12:43pm.  York Spaniel that she forgot the lab work and will have it done tonight after work.  She is at [phone].

## 2022-09-02 ENCOUNTER — Encounter: Admit: 2022-09-02 | Discharge: 2022-09-02 | Payer: Private Health Insurance - Indemnity

## 2022-09-02 NOTE — Telephone Encounter
Attempted to call pt. LVM requesting CB. Will also send [facility].

## 2022-09-03 NOTE — Telephone Encounter
Golda Acre, LPN  P Cvm Nurse Ep Team C  VM on triage line from patient at 12:10pm.  York Spaniel that she has been home so sick for the past 4 days.  I did not get labs done.  Will call when I feel better.  She was SOA while trying to talk.  She is at [phone].

## 2022-09-07 ENCOUNTER — Encounter: Admit: 2022-09-07 | Discharge: 2022-09-07 | Payer: Private Health Insurance - Indemnity

## 2022-09-07 NOTE — Telephone Encounter
RC to pt. She states she is currently admitted at [facility] for low HGB. She said on 8/26 HGB 7.0. Today reports HGB up to 7.7. She said she is having an EGD today. If that is negative then she will do colonoscopy tomorrow.  States this is why she has not completed requested lab work.     Reviewed plan with the patient. Patient verbalized understanding and does not have any further questions or concerns. No further education requested from patient. Patient has our contact information for future needs.

## 2022-09-07 NOTE — Telephone Encounter
-----   Message from Gisela B sent at 09/07/2022  9:21 AM CDT -----  Regarding: RAD- in hospital  VM on triage line from patient at 8:59am.  York Spaniel that she was sick last week and ended up in hospital on Friday.  She is having procedure today and does not know when she will get out.  Said to call hospital if you need more information, but she did not say which hospital she is in.  Caller ID [phone].

## 2022-09-22 ENCOUNTER — Encounter: Admit: 2022-09-22 | Discharge: 2022-09-22 | Payer: Private Health Insurance - Indemnity

## 2022-09-22 MED ORDER — ATORVASTATIN 20 MG PO TAB
20 mg | ORAL_TABLET | Freq: Every day | ORAL | 3 refills | Status: AC
Start: 2022-09-22 — End: ?

## 2022-10-05 ENCOUNTER — Encounter: Admit: 2022-10-05 | Discharge: 2022-10-05 | Payer: Private Health Insurance - Indemnity

## 2022-10-05 MED ORDER — PROPRANOLOL 60 MG PO CS24
60 mg | ORAL_CAPSULE | Freq: Every day | ORAL | 0 refills
Start: 2022-10-05 — End: ?

## 2022-10-06 ENCOUNTER — Encounter: Admit: 2022-10-06 | Discharge: 2022-10-06 | Payer: Private Health Insurance - Indemnity

## 2022-10-17 IMAGING — CT ABDOMEN_PELVIS W(Adult)
2 of 3 series · 12 of 46 positions shown, 14 images · non-contrast
Comparison: No relevant prior studies available.

DIAGNOSTIC STUDIES

EXAM:  CT ABDOMEN AND PELVIS WITH INTRAVENOUS CONTRAST  ([id number])
INDICATION: abd pain PT C/O RT SIDED ABD PAIN. KPPS2 OMNImpression.00 GIVEN VIA IV. CREAT 0.83, GFR
84. HX CHOLE, APPY, HYSTERECTOMY AND HIATAL HERNIA REPAIR. AK
TECHNIQUE: Axial computed tomography images of the abdomen and pelvis with intravenous contrast.

[Series 2: abdomen_pelvis ax 3.00 br40 s3 · axial · 0.61mm/px · z∈[+1565,+1955]mm · 9 of 150 slices shown, 11 images]
[im 10/150  soft-tissue]
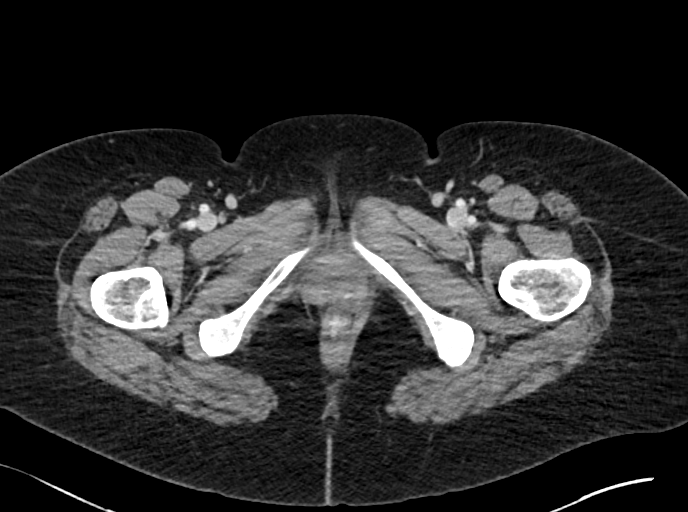
[im 10/150  bone]
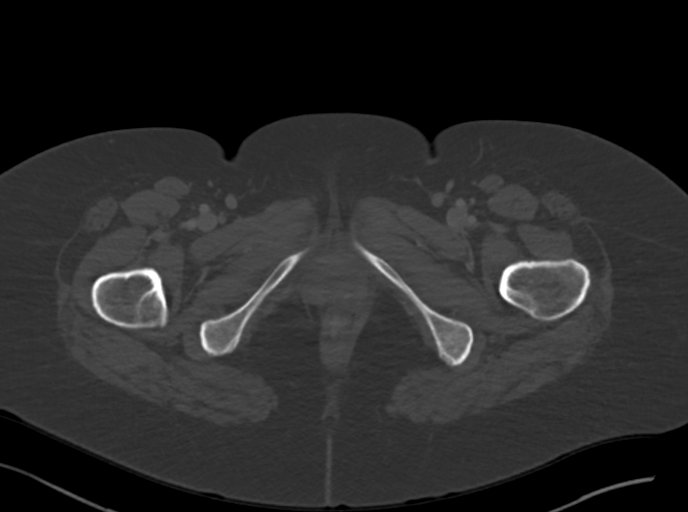
[im 29/150  soft-tissue]
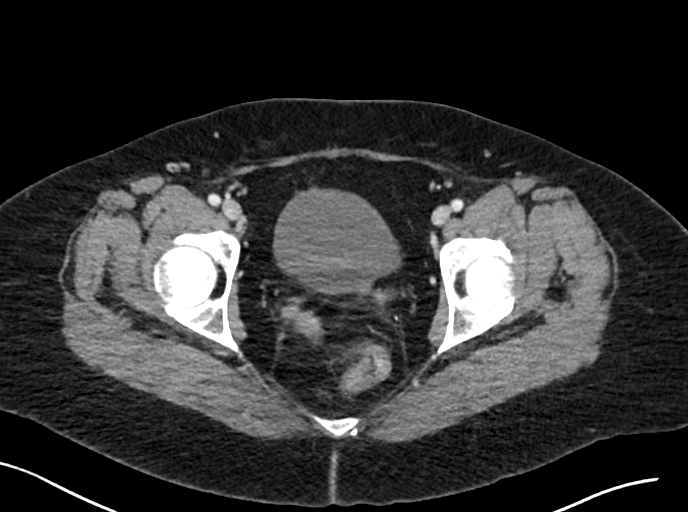
[im 44/150  soft-tissue]
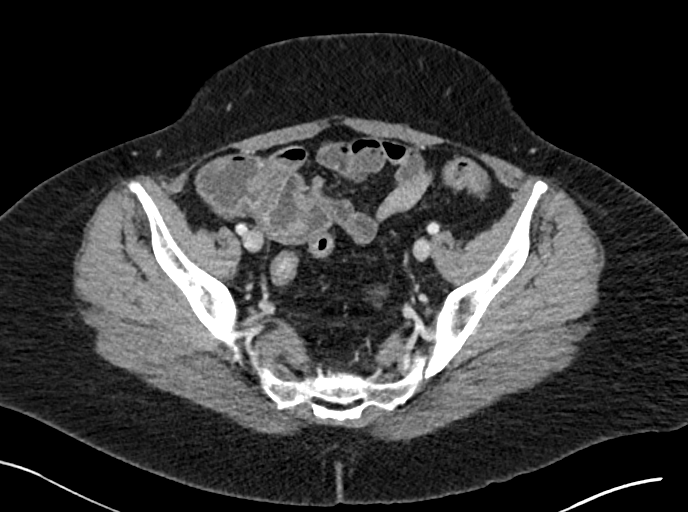
[im 58/150  soft-tissue]
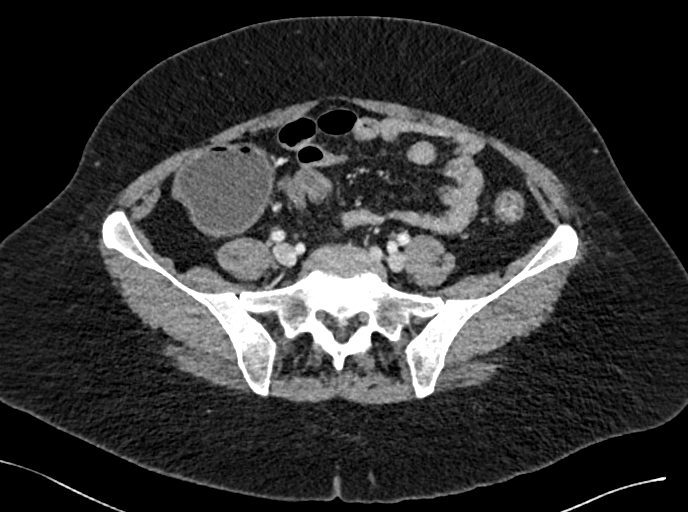
[im 77/150  soft-tissue]
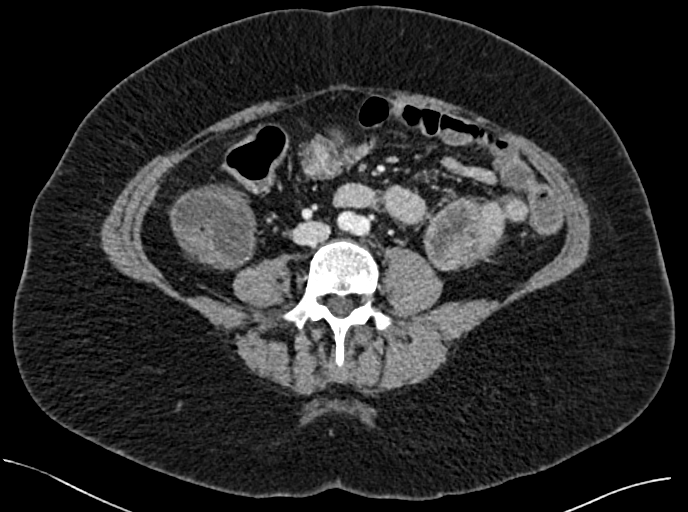
[im 92/150  soft-tissue]
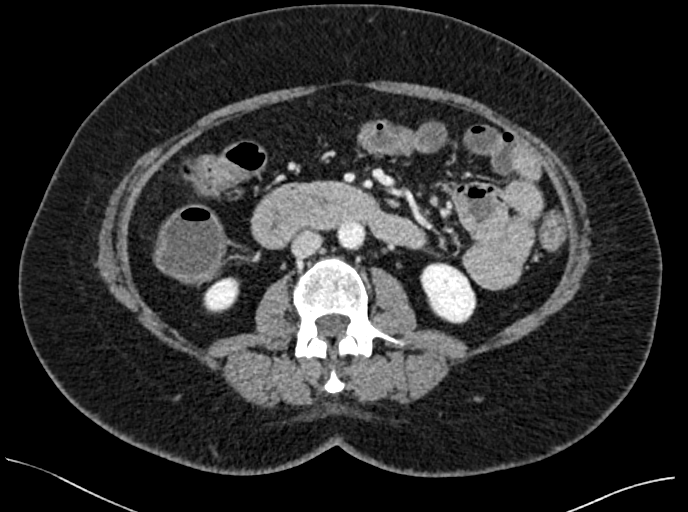
[im 106/150  soft-tissue]
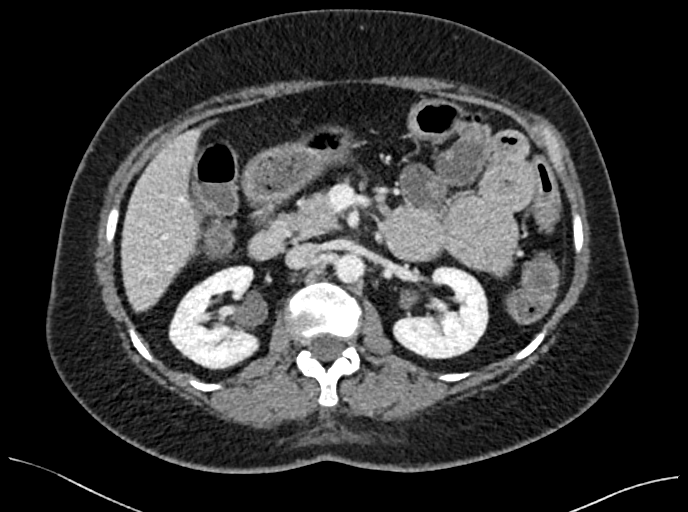
[im 125/150  soft-tissue]
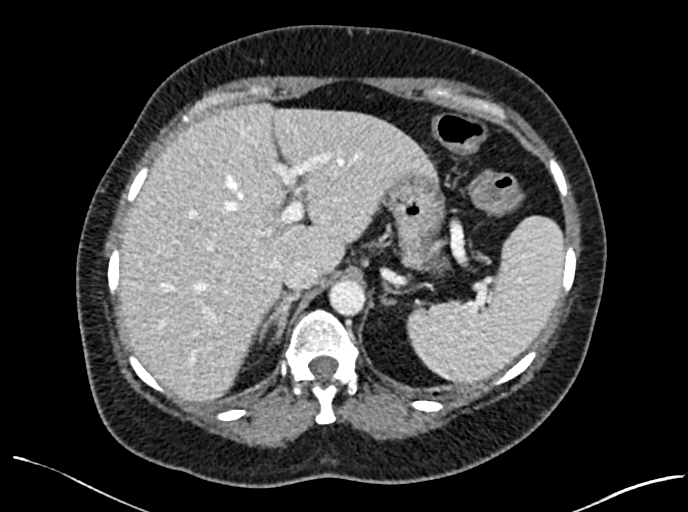
[im 140/150  soft-tissue]
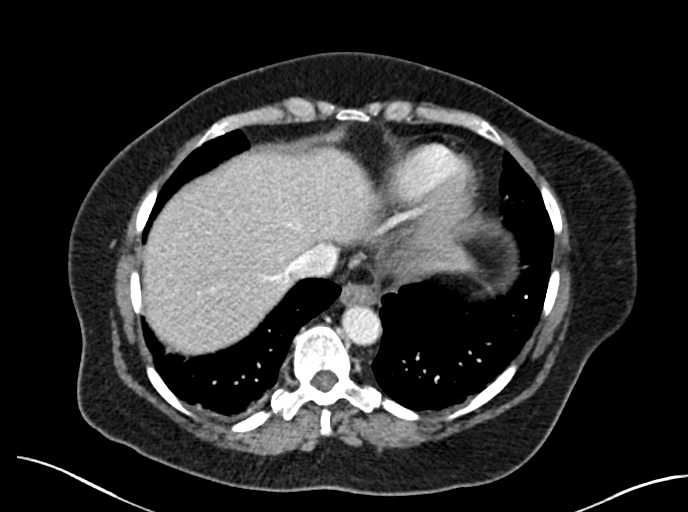
[im 140/150  bone]
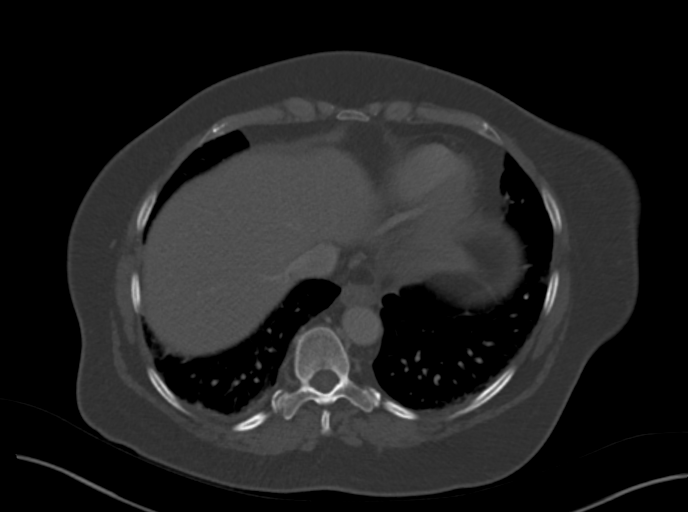

[Series 4: abdomen_pelvis cor 3.00 br40 s3 · coronal · 0.82mm/px · 3 of 103 slices shown]
[im 35/103  soft-tissue]
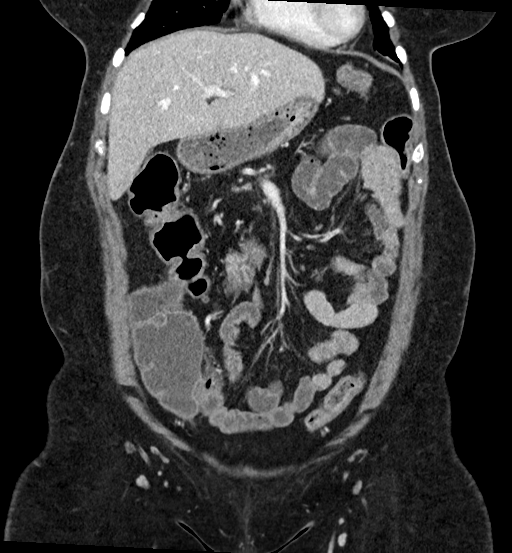
[im 46/103  soft-tissue]
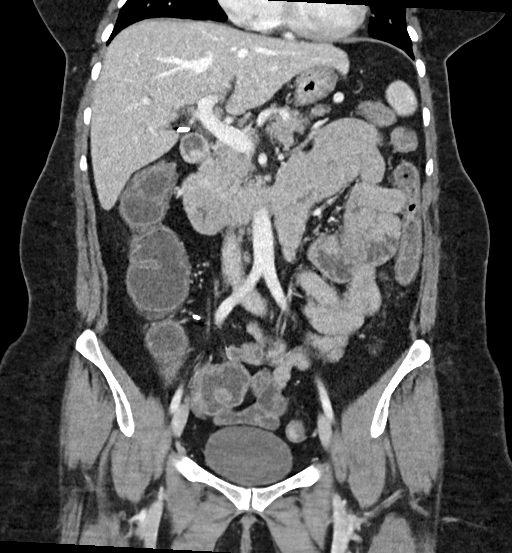
[im 57/103  soft-tissue]
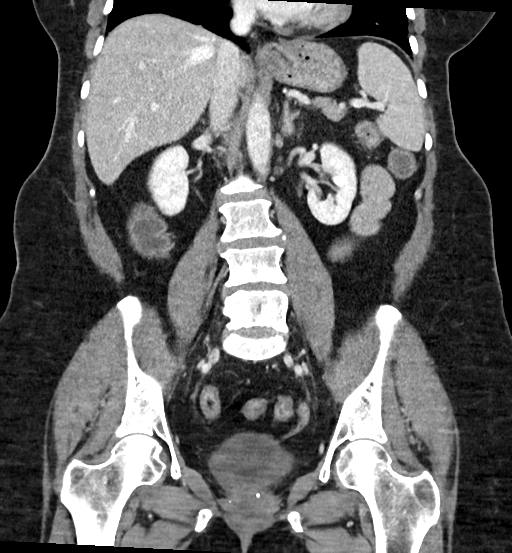

[12 of 46 positions shown; findings below may reference images not displayed]

All CT scans at this facility use dose modulation, interval reconstruction, and/or weight-based
dosing when appropriate to reduce radiation dose to as low as reasonably achievable.

Sagittal and coronal reformatted images were created and reviewed.

All CT scans at this facility use dose modulation, interval reconstruction, and/or weight-based
dosing when appropriate to reduce radiation dose to as low as reasonably achievable.

Number of previous computed tomography exams in the last 12 months is 0. Number of previous nuclear
medicine myocardial perfusion studies in the last 12 months is 0.

CONTRAST:  100 mL of low osmolar intravenous iodinated contrast material was administered.
FINDINGS: LUNG BASES:  Minimal-to-mild dependent changes/atelectasis.

ABDOMEN:

LIVER:  NO mass or abnormal enlargement.

GALLBLADDER AND BILE DUCTS:  Postsurgical changes from prior cholecystectomy.

PANCREAS:  NO edema or inflammatory changes. NO significant ductal dilatation. NO definite mass.

SPLEEN:  NO mass or abnormal enlargement.

ADRENALS:  NO mass or significant, abnormal enlargement.

KIDNEYS AND URETERS:  Mild RIGHT renal collecting system dilatation. NO intrarenal nonobstructing
stones. NO definite ureteral stones.

Mild LEFT renal collecting system dilatation. NO intrarenal nonobstructing stones. NO definite
ureteral stones.

STOMACH AND BOWEL:  The stomach demonstrates pseudothickening from incomplete distention.

The small bowel demonstrates nonspecific air-fluid levels. NO significant distention.

ABNORMAL FINDING, FURTHER EVALUATION RECOMMENDED: Mild mucosal/wall thickening in the RIGHT colon
extending to the proximal transverse segment. This is most consistent with colitis. Pseudothickening
from limited distention and/or spasm in the LEFT and distal colon segments versus colitis.

Mild distal colonic diverticulosis.  NO specific CT evidence for acute diverticulitis.

PELVIS:

APPENDIX:  The appendix is surgically absent by history.

BLADDER:  NO significant wall thickening or stones.

REPRODUCTIVE:  Uterus demonstrates NO gross, acute changes. NO ovarian masses or cysts greater than
3 cm.

ABDOMEN and PELVIS:

BONES/JOINTS:  NO acute fractures. NO dislocations.

SOFT TISSUES:  Soft tissues demonstrate NO fluid collections or foreign body.

VASCULATURE:  Pelvic calcifications most consistent with phleboliths.

No abdominal aortic aneurysm.

LYMPH NODES:  NO pathologically enlarged lymph nodes.
IMPRESSION: - ABNORMAL FINDING, FURTHER EVALUATION RECOMMENDED: Mild mucosal/wall thickening in the RIGHT colon
extending to the proximal transverse segment. This is most consistent with colitis. Pseudothickening
from limited distention and/or spasm in the LEFT and distal colon segments versus colitis.

- Renal findings as described. The differential diagnosis includes recently or previously passed
stone, pyelonephritis, chronic reflux and/or distal stenosis.

- Small bowel findings as described. The differential includes ileus and/or enteritis. NO definite
evidence of obstruction.

- Additional chronic/incidental findings as described.

Tech Notes:

PT C/O RT SIDED ABD PAIN. KPPS2 [place] GIVEN VIA IV. CREAT 0.83, GFR 84. HX CHOLE, APPY,
HYSTERECTOMY AND HIATAL HERNIA REPAIR. AK

## 2023-04-01 ENCOUNTER — Encounter: Admit: 2023-04-01 | Discharge: 2023-04-01 | Payer: Private Health Insurance - Indemnity

## 2023-06-30 ENCOUNTER — Encounter: Admit: 2023-06-30 | Discharge: 2023-06-30 | Payer: PRIVATE HEALTH INSURANCE
